# Patient Record
Sex: Female | Born: 1980 | Race: White | Hispanic: No | Marital: Married | Smoking: Former smoker
Health system: Southern US, Community
[De-identification: ages and names within clinical notes are randomized; demographics above are authoritative.]

## PROBLEM LIST (undated history)

## (undated) DIAGNOSIS — F410 Panic disorder [episodic paroxysmal anxiety] without agoraphobia: Secondary | ICD-10-CM

## (undated) DIAGNOSIS — E611 Iron deficiency: Secondary | ICD-10-CM

## (undated) DIAGNOSIS — Z8742 Personal history of other diseases of the female genital tract: Secondary | ICD-10-CM

## (undated) DIAGNOSIS — D649 Anemia, unspecified: Secondary | ICD-10-CM

## (undated) HISTORY — PX: TONSILLECTOMY: SUR1361

## (undated) HISTORY — PX: ABDOMINAL SURGERY: SHX537

---

## 2009-01-02 ENCOUNTER — Ambulatory Visit: Payer: Self-pay

## 2009-01-06 ENCOUNTER — Ambulatory Visit: Payer: Self-pay

## 2009-03-07 ENCOUNTER — Emergency Department: Payer: Self-pay | Admitting: Emergency Medicine

## 2009-09-20 ENCOUNTER — Emergency Department: Payer: Self-pay | Admitting: Emergency Medicine

## 2013-12-22 ENCOUNTER — Emergency Department: Payer: Self-pay | Admitting: Emergency Medicine

## 2013-12-22 LAB — BASIC METABOLIC PANEL
Anion Gap: 7 (ref 7–16)
BUN: 9 mg/dL (ref 7–18)
CO2: 29 mmol/L (ref 21–32)
CREATININE: 0.71 mg/dL (ref 0.60–1.30)
Calcium, Total: 8.6 mg/dL (ref 8.5–10.1)
Chloride: 105 mmol/L (ref 98–107)
EGFR (Non-African Amer.): >60
GLUCOSE: 125 mg/dL — AB (ref 65–99)
Osmolality: 281 (ref 275–301)
POTASSIUM: 3.6 mmol/L (ref 3.5–5.1)
SODIUM: 141 mmol/L (ref 136–145)

## 2013-12-22 LAB — CBC
HCT: 40 % (ref 35.0–47.0)
HGB: 13.3 g/dL (ref 12.0–16.0)
MCH: 27.1 pg (ref 26.0–34.0)
MCHC: 33.3 g/dL (ref 32.0–36.0)
MCV: 82 fL (ref 80–100)
PLATELETS: 282 10*3/uL (ref 150–440)
RBC: 4.9 10*6/uL (ref 3.80–5.20)
RDW: 13.9 % (ref 11.5–14.5)
WBC: 10.3 10*3/uL (ref 3.6–11.0)

## 2013-12-22 LAB — HCG, QUANTITATIVE, PREGNANCY

## 2014-01-20 ENCOUNTER — Emergency Department: Payer: Self-pay | Admitting: Emergency Medicine

## 2014-07-21 ENCOUNTER — Emergency Department
Admission: EM | Admit: 2014-07-21 | Discharge: 2014-07-21 | Disposition: A | Discharge status: Home / Self Care | Payer: BLUE CROSS/BLUE SHIELD | Attending: Emergency Medicine | Admitting: Emergency Medicine

## 2014-07-21 ENCOUNTER — Emergency Department: Payer: BLUE CROSS/BLUE SHIELD

## 2014-07-21 ENCOUNTER — Encounter: Payer: Self-pay | Admitting: Emergency Medicine

## 2014-07-21 DIAGNOSIS — Z87891 Personal history of nicotine dependence: Secondary | ICD-10-CM | POA: Insufficient documentation

## 2014-07-21 DIAGNOSIS — M62838 Other muscle spasm: Secondary | ICD-10-CM | POA: Diagnosis not present

## 2014-07-21 DIAGNOSIS — M62830 Muscle spasm of back: Secondary | ICD-10-CM | POA: Diagnosis not present

## 2014-07-21 DIAGNOSIS — M549 Dorsalgia, unspecified: Secondary | ICD-10-CM | POA: Diagnosis present

## 2014-07-21 MED ORDER — NAPROXEN 500 MG PO TABS: 500.0000 mg | ORAL_TABLET | Freq: Two times a day (BID) | ORAL | Status: AC

## 2014-07-21 MED ORDER — ORPHENADRINE CITRATE 30 MG/ML IJ SOLN
60.0000 mg | Freq: Once | INTRAMUSCULAR | Status: AC
  Administered 2014-07-21: 60 mg via INTRAMUSCULAR

## 2014-07-21 MED ORDER — ORPHENADRINE CITRATE 30 MG/ML IJ SOLN
INTRAMUSCULAR | Status: AC
  Filled 2014-07-21: qty 2

## 2014-07-21 MED ORDER — CYCLOBENZAPRINE HCL 10 MG PO TABS: 10.0000 mg | ORAL_TABLET | Freq: Three times a day (TID) | ORAL | Status: AC | PRN

## 2014-07-21 NOTE — Discharge Instructions (Signed)

## 2014-07-21 NOTE — ED Provider Notes (Signed)
CSN: [id number]     Arrival date & time 07/21/14  1024 History   First MD Initiated Contact with Patient 07/21/14 1058     Chief Complaint  Patient presents with  . Back Pain    HPI Comments: 34 year old morbidly obese female presents today complaining of lower neck and upper back pain that began yesterday morning. She reports that she has some mild soreness every morning when she wakes up in her lower neck. She has attributed it to her bed in the past. She has not had a known injury to her neck or back. She does not do any heavy lifting. A lot of times her pain will subside as the day goes on. This time her pain is unrelenting. Has taken some tramadol she had without relief of pain. No tingling or numbness in her legs or arms.  Patient is a 34 y.o. female presenting with back pain. The history is provided by the patient.  Back Pain Location:  Thoracic spine Quality:  Aching Radiates to:  Does not radiate Pain severity:  Moderate Pain is:  Same all the time Onset quality:  Gradual Timing:  Constant Progression:  Worsening Chronicity:  Recurrent Context: not emotional stress, not falling, not jumping from heights, not lifting heavy objects, not occupational injury, not physical stress and not recent injury   Relieved by:  None tried Worsened by:  Ambulation, bending, standing and twisting Ineffective treatments:  NSAIDs Associated symptoms: no bladder incontinence, no bowel incontinence, no dysuria, no fever, no leg pain, no numbness, no paresthesias, no tingling and no weakness   Risk factors: obesity     History reviewed. No pertinent past medical history. History reviewed. No pertinent past surgical history. No family history on file. History  Substance Use Topics  . Smoking status: Former Games developer  . Smokeless tobacco: Not on file  . Alcohol Use: Yes     Comment: occas   OB History    No data available     Review of Systems  Constitutional: Negative for fever.    Gastrointestinal: Negative for bowel incontinence.  Endocrine: Negative for polyuria.  Genitourinary: Negative for bladder incontinence and dysuria.  Musculoskeletal: Positive for myalgias, back pain, arthralgias and neck pain.  Neurological: Negative for tingling, weakness, numbness and paresthesias.  All other systems reviewed and are negative.     Allergies  Review of patient's allergies indicates no known allergies.  Home Medications   Prior to Admission medications   Medication Sig Start Date End Date Taking? Authorizing Provider  cyclobenzaprine (FLEXERIL) 10 MG tablet Take 1 tablet (10 mg total) by mouth every 8 (eight) hours as needed for muscle spasms. 07/21/14 07/21/15  Luvenia Redden, PA-C  naproxen (NAPROSYN) 500 MG tablet Take 1 tablet (500 mg total) by mouth 2 (two) times daily with a meal. 07/21/14 07/21/15  Wilber Oliphant V, PA-C   BP 119/80 mmHg  Pulse 69  Temp(Src) 97.5 F (36.4 C) (Oral)  Resp 18  Ht 5\' 6"  (1.676 m)  Wt 365 lb (165.563 kg)  BMI 58.94 kg/m2  SpO2 96%  LMP 07/14/2014 Physical Exam  Constitutional: She is oriented to person, place, and time. Vital signs are normal. She appears well-developed and well-nourished.  Non-toxic appearance. She does not have a sickly appearance.  Morbidly obese  HENT:  Head: Normocephalic and atraumatic.  Neck: Neck supple. Spinous process tenderness present.    Patient experiences pain with right lateral rotation. No pain with left lateral rotation. Has pain with flexion,  no pain with extension  Musculoskeletal: Normal range of motion.       Thoracic back: She exhibits tenderness, bony tenderness and spasm.       Lumbar back: Normal. She exhibits no tenderness, no bony tenderness and no spasm.       Back:  Upper thoracic spine tender to palpation. Bilateral thoracic paraspinal muscles tender to palpation with marked spasm.  Neurological: She is alert and oriented to person, place, and time. She has normal strength. No  sensory deficit. She exhibits normal muscle tone.  Equal grip strength bilateral upper extremities 5/5  Skin: Skin is warm and dry.  Psychiatric: She has a normal mood and affect. Her behavior is normal. Judgment and thought content normal.  Nursing note and vitals reviewed.   ED Course  Procedures (including critical care time) Labs Review Labs Reviewed - No data to display  Imaging Review Dg Cervical Spine Complete  07/21/2014   CLINICAL DATA:  34 year old female with posterior neck pain and upper back pain for the past 24 hours.  EXAM: CERVICAL SPINE  4+ VIEWS  COMPARISON:  Neck CT 12/22/2013.  FINDINGS: There is no evidence of cervical spine fracture or prevertebral soft tissue swelling. Alignment is normal. No other significant bone abnormalities are identified.  IMPRESSION: Negative cervical spine radiographs.   Electronically Signed   By: Trudie Reed M.D.   On: 07/21/2014 11:51   Dg Thoracic Spine 2 View  07/21/2014   CLINICAL DATA:  34 year old female with upper back pain for the past 24 hours.  EXAM: THORACIC SPINE - 2 VIEW  COMPARISON:  No priors.  FINDINGS: There is no evidence of thoracic spine fracture. Alignment is normal. No other significant bone abnormalities are identified.  IMPRESSION: Negative.   Electronically Signed   By: Trudie Reed M.D.   On: 07/21/2014 11:52     EKG Interpretation None      MDM  I have reviewed her cervical and thoracic spine x-rays. Patient has no evidence of early arthritis or compression fractures. I believe her pain is a result of muscle spasms which are palpable on exam. This could be because of her old mattress or from the persistent cough she has had for the past couple weeks. Advised her to use moist heat, stretching. Naproxen twice a day with food, Flexeril as needed for muscle spasms. Drowsiness warning given regarding Flexeril. Return for new or worsening symptoms. For persistent symptoms she should follow-up with Dr. Judi Saa,  orthopedist. Final diagnoses:  Neck muscle spasm  Back muscle spasm       Luvenia Redden, PA-C 07/21/14 1245  Jene Every, MD 07/21/14 1440

## 2014-07-21 NOTE — ED Notes (Signed)
Pt c/o neck/upper back pain, denies injury to area.

## 2014-07-21 NOTE — ED Notes (Signed)
Pt verbalized understanding of discharge instructions and prescriptions 

## 2015-04-25 ENCOUNTER — Emergency Department
Admission: EM | Admit: 2015-04-25 | Discharge: 2015-04-25 | Disposition: A | Discharge status: Home / Self Care | Payer: BLUE CROSS/BLUE SHIELD | Attending: Emergency Medicine | Admitting: Emergency Medicine

## 2015-04-25 ENCOUNTER — Emergency Department: Payer: BLUE CROSS/BLUE SHIELD

## 2015-04-25 DIAGNOSIS — J069 Acute upper respiratory infection, unspecified: Secondary | ICD-10-CM | POA: Diagnosis present

## 2015-04-25 DIAGNOSIS — J209 Acute bronchitis, unspecified: Secondary | ICD-10-CM | POA: Insufficient documentation

## 2015-04-25 DIAGNOSIS — Z87891 Personal history of nicotine dependence: Secondary | ICD-10-CM | POA: Diagnosis not present

## 2015-04-25 HISTORY — DX: Iron deficiency: E61.1

## 2015-04-25 LAB — BASIC METABOLIC PANEL
ANION GAP: 7 (ref 5–15)
BUN: 15 mg/dL (ref 6–20)
CHLORIDE: 104 mmol/L (ref 101–111)
CO2: 24 mmol/L (ref 22–32)
Calcium: 8.8 mg/dL — ABNORMAL LOW (ref 8.9–10.3)
Creatinine, Ser: 0.77 mg/dL (ref 0.44–1.00)
Glucose, Bld: 188 mg/dL — ABNORMAL HIGH (ref 65–99)
POTASSIUM: 3.4 mmol/L — AB (ref 3.5–5.1)
SODIUM: 135 mmol/L (ref 135–145)

## 2015-04-25 LAB — CBC WITH DIFFERENTIAL/PLATELET
BASOS ABS: 0 10*3/uL (ref 0–0.1)
BASOS PCT: 0 %
EOS ABS: 0 10*3/uL (ref 0–0.7)
Eosinophils Relative: 1 %
HCT: 38.3 % (ref 35.0–47.0)
HEMOGLOBIN: 12.9 g/dL (ref 12.0–16.0)
LYMPHS ABS: 1.1 10*3/uL (ref 1.0–3.6)
Lymphocytes Relative: 24 %
MCH: 25.5 pg — ABNORMAL LOW (ref 26.0–34.0)
MCHC: 33.8 g/dL (ref 32.0–36.0)
MCV: 75.6 fL — ABNORMAL LOW (ref 80.0–100.0)
Monocytes Absolute: 0.6 10*3/uL (ref 0.2–0.9)
Monocytes Relative: 13 %
NEUTROS PCT: 62 %
Neutro Abs: 2.9 10*3/uL (ref 1.4–6.5)
PLATELETS: 245 10*3/uL (ref 150–440)
RBC: 5.07 MIL/uL (ref 3.80–5.20)
RDW: 15.4 % — ABNORMAL HIGH (ref 11.5–14.5)
WBC: 4.7 10*3/uL (ref 3.6–11.0)

## 2015-04-25 MED ORDER — PSEUDOEPHEDRINE HCL 60 MG PO TABS: 60.0000 mg | ORAL_TABLET | ORAL | Status: DC | PRN

## 2015-04-25 MED ORDER — GUAIFENESIN-CODEINE 100-10 MG/5ML PO SOLN: 10.0000 mL | ORAL | Status: DC | PRN

## 2015-04-25 MED ORDER — AZITHROMYCIN 250 MG PO TABS: ORAL_TABLET | ORAL | Status: DC

## 2015-04-25 NOTE — ED Notes (Signed)
Pt c/o congestion/cough fever for the past couple of days, states today she is having dizziness.. Pt is in NAD.

## 2015-04-25 NOTE — ED Notes (Signed)
States she developed fever couple of days ago    Afebrile this am  But while at work she became dizzy.Marland Kitchen. Also having cough  congestion

## 2015-04-25 NOTE — Discharge Instructions (Signed)
Acute Bronchitis °Bronchitis is inflammation of the airways that extend from the windpipe into the lungs (bronchi). The inflammation often causes mucus to develop. This leads to a cough, which is the most common symptom of bronchitis.  °In acute bronchitis, the condition usually develops suddenly and goes away over time, usually in a couple weeks. Smoking, allergies, and asthma can make bronchitis worse. Repeated episodes of bronchitis may cause further lung problems.  °CAUSES °Acute bronchitis is most often caused by the same virus that causes a cold. The virus can spread from person to person (contagious) through coughing, sneezing, and touching contaminated objects. °SIGNS AND SYMPTOMS  °· Cough.   °· Fever.   °· Coughing up mucus.   °· Body aches.   °· Chest congestion.   °· Chills.   °· Shortness of breath.   °· Sore throat.   °DIAGNOSIS  °Acute bronchitis is usually diagnosed through a physical exam. Your health care provider will also ask you questions about your medical history. Tests, such as chest X-rays, are sometimes done to rule out other conditions.  °TREATMENT  °Acute bronchitis usually goes away in a couple weeks. Oftentimes, no medical treatment is necessary. Medicines are sometimes given for relief of fever or cough. Antibiotic medicines are usually not needed but may be prescribed in certain situations. In some cases, an inhaler may be recommended to help reduce shortness of breath and control the cough. A cool mist vaporizer may also be used to help thin bronchial secretions and make it easier to clear the chest.  °HOME CARE INSTRUCTIONS °· Get plenty of rest.   °· Drink enough fluids to keep your urine clear or pale yellow (unless you have a medical condition that requires fluid restriction). Increasing fluids may help thin your respiratory secretions (sputum) and reduce chest congestion, and it will prevent dehydration.   °· Take medicines only as directed by your health care provider. °· If  you were prescribed an antibiotic medicine, finish it all even if you start to feel better. °· Avoid smoking and secondhand smoke. Exposure to cigarette smoke or irritating chemicals will make bronchitis worse. If you are a smoker, consider using nicotine gum or skin patches to help control withdrawal symptoms. Quitting smoking will help your lungs heal faster.   °· Reduce the chances of another bout of acute bronchitis by washing your hands frequently, avoiding people with cold symptoms, and trying not to touch your hands to your mouth, nose, or eyes.   °· Keep all follow-up visits as directed by your health care provider.   °SEEK MEDICAL CARE IF: °Your symptoms do not improve after 1 week of treatment.  °SEEK IMMEDIATE MEDICAL CARE IF: °· You develop an increased fever or chills.   °· You have chest pain.   °· You have severe shortness of breath. °· You have bloody sputum.   °· You develop dehydration. °· You faint or repeatedly feel like you are going to pass out. °· You develop repeated vomiting. °· You develop a severe headache. °MAKE SURE YOU:  °· Understand these instructions. °· Will watch your condition. °· Will get help right away if you are not doing well or get worse. °  °This information is not intended to replace advice given to you by your health care provider. Make sure you discuss any questions you have with your health care provider. °  °Document Released: 02/29/2004 Document Revised: 02/11/2014 Document Reviewed: 07/14/2012 °Elsevier Interactive Patient Education ©2016 [employer] ° °Upper Respiratory Infection, Adult °Most upper respiratory infections (URIs) are a viral infection of the air passages leading   to the lungs. A URI affects the nose, throat, and upper air passages. The most common type of URI is nasopharyngitis and is typically referred to as "the common cold." °URIs run their course and usually go away on their own. Most of the time, a URI does not require medical attention, but  sometimes a bacterial infection in the upper airways can follow a viral infection. This is called a secondary infection. Sinus and middle ear infections are common types of secondary upper respiratory infections. °Bacterial pneumonia can also complicate a URI. A URI can worsen asthma and chronic obstructive pulmonary disease (COPD). Sometimes, these complications can require emergency medical care and may be life threatening.  °CAUSES °Almost all URIs are caused by viruses. A virus is a type of germ and can spread from one person to another.  °RISKS FACTORS °You may be at risk for a URI if:  °· You smoke.   °· You have chronic heart or lung disease. °· You have a weakened defense (immune) system.   °· You are very young or very old.   °· You have nasal allergies or asthma. °· You work in crowded or poorly ventilated areas. °· You work in health care facilities or schools. °SIGNS AND SYMPTOMS  °Symptoms typically develop 2-3 days after you come in contact with a cold virus. Most viral URIs last 7-10 days. However, viral URIs from the influenza virus (flu virus) can last 14-18 days and are typically more severe. Symptoms may include:  °· Runny or stuffy (congested) nose.   °· Sneezing.   °· Cough.   °· Sore throat.   °· Headache.   °· Fatigue.   °· Fever.   °· Loss of appetite.   °· Pain in your forehead, behind your eyes, and over your cheekbones (sinus pain). °· Muscle aches.   °DIAGNOSIS  °Your health care provider may diagnose a URI by: °· Physical exam. °· Tests to check that your symptoms are not due to another condition such as: °¨ Strep throat. °¨ Sinusitis. °¨ Pneumonia. °¨ Asthma. °TREATMENT  °A URI goes away on its own with time. It cannot be cured with medicines, but medicines may be prescribed or recommended to relieve symptoms. Medicines may help: °· Reduce your fever. °· Reduce your cough. °· Relieve nasal congestion. °HOME CARE INSTRUCTIONS  °· Take medicines only as directed by your health care  provider.   °· Gargle warm saltwater or take cough drops to comfort your throat as directed by your health care provider. °· Use a warm mist humidifier or inhale steam from a shower to increase air moisture. This may make it easier to breathe. °· Drink enough fluid to keep your urine clear or pale yellow.   °· Eat soups and other clear broths and maintain good nutrition.   °· Rest as needed.   °· Return to work when your temperature has returned to normal or as your health care provider advises. You may need to stay home longer to avoid infecting others. You can also use a face mask and careful hand washing to prevent spread of the virus. °· Increase the usage of your inhaler if you have asthma.   °· Do not use any tobacco products, including cigarettes, chewing tobacco, or electronic cigarettes. If you need help quitting, ask your health care provider. °PREVENTION  °The best way to protect yourself from getting a cold is to practice good hygiene.  °· Avoid oral or hand contact with people with cold symptoms.   °· Wash your hands often if contact occurs.   °There is no clear evidence that   vitamin C, vitamin E, echinacea, or exercise reduces the chance of developing a cold. However, it is always recommended to get plenty of rest, exercise, and practice good nutrition.  °SEEK MEDICAL CARE IF:  °· You are getting worse rather than better.   °· Your symptoms are not controlled by medicine.   °· You have chills. °· You have worsening shortness of breath. °· You have brown or red mucus. °· You have yellow or brown nasal discharge. °· You have pain in your face, especially when you bend forward. °· You have a fever. °· You have swollen neck glands. °· You have pain while swallowing. °· You have white areas in the back of your throat. °SEEK IMMEDIATE MEDICAL CARE IF:  °· You have severe or persistent: °¨ Headache. °¨ Ear pain. °¨ Sinus pain. °¨ Chest pain. °· You have chronic lung disease and any of the  following: °¨ Wheezing. °¨ Prolonged cough. °¨ Coughing up blood. °¨ A change in your usual mucus. °· You have a stiff neck. °· You have changes in your: °¨ Vision. °¨ Hearing. °¨ Thinking. °¨ Mood. °MAKE SURE YOU:  °· Understand these instructions. °· Will watch your condition. °· Will get help right away if you are not doing well or get worse. °  °This information is not intended to replace advice given to you by your health care provider. Make sure you discuss any questions you have with your health care provider. °  °Document Released: 07/17/2000 Document Revised: 06/07/2014 Document Reviewed: 04/28/2013 °Elsevier Interactive Patient Education ©2016 [employer]vier Inc. ° °

## 2015-04-25 NOTE — ED Provider Notes (Signed)
[facility] Emergency Department Provider Note  ____________________________________________  Time seen: Approximately 11:45 AM  I have reviewed the triage vital signs and the nursing notes.   HISTORY  Chief Complaint Dizziness and URI    HPI Carla Daniels is a 35 y.o. female presents for evaluation of fever chills body aches coughing and dizziness. Patient states that she does not have a local PCP and is concerned about possible pneumonia or bronchitis or the flu.   Past Medical History  Diagnosis Date  . Iron deficiency     There are no active problems to display for this patient.   Past Surgical History  Procedure Laterality Date  . Tonsillectomy      Current Outpatient Rx  Name  Route  Sig  Dispense  Refill  . azithromycin (ZITHROMAX Z-PAK) 250 MG tablet      Take 2 tablets (500 mg) on  Day 1,  followed by 1 tablet (250 mg) once daily on Days 2 through 5.   6 each   0   . cyclobenzaprine (FLEXERIL) 10 MG tablet   Oral   Take 1 tablet (10 mg total) by mouth every 8 (eight) hours as needed for muscle spasms.   30 tablet   0   . guaiFENesin-codeine 100-10 MG/5ML syrup   Oral   Take 10 mLs by mouth every 4 (four) hours as needed for cough.   180 mL   0   . naproxen (NAPROSYN) 500 MG tablet   Oral   Take 1 tablet (500 mg total) by mouth 2 (two) times daily with a meal.   30 tablet   0   . pseudoephedrine (SUDAFED) 60 MG tablet   Oral   Take 1 tablet (60 mg total) by mouth every 4 (four) hours as needed for congestion.   24 tablet   0     Allergies Review of patient's allergies indicates no known allergies.  No family history on file.  Social History Social History  Substance Use Topics  . Smoking status: Former Games developer  . Smokeless tobacco: None  . Alcohol Use: Yes     Comment: occas    Review of Systems Constitutional: Positive fever/chills Eyes: No visual changes. ENT: No sore throat. Cardiovascular:  Denies chest pain. Respiratory: Denies shortness of breath. Positive for coughing Gastrointestinal: No abdominal pain.  No nausea, no vomiting.  No diarrhea.  No constipation. Genitourinary: Negative for dysuria. Musculoskeletal: Negative for back pain. Positive morbid obesity Skin: Negative for rash. Neurological: Negative for headaches, focal weakness or numbness.  10-point ROS otherwise negative.  ____________________________________________   PHYSICAL EXAM:  VITAL SIGNS: ED Triage Vitals  Enc Vitals Group     BP 04/25/15 1054 123/61 mmHg     Pulse Rate 04/25/15 1054 93     Resp 04/25/15 1054 22     Temp 04/25/15 1054 98.7 F (37.1 C)     Temp Source 04/25/15 1054 Oral     SpO2 04/25/15 1054 96 %     Weight 04/25/15 1054 370 lb (167.831 kg)     Height 04/25/15 1054  (1.676 m)     Head Cir --      Peak Flow --      Pain Score 04/25/15 1113 5     Pain Loc --      Pain Edu? --      Excl. in GC? --     Constitutional: Alert and oriented. Well appearing and in no acute  distress. Eyes: Conjunctivae are normal. PERRL. EOMI. Head: Atraumatic. Nose: No congestion/rhinnorhea. Mouth/Throat: Mucous membranes are moist.  Oropharynx non-erythematous. Neck: No stridor.   Cardiovascular: Normal rate, regular rhythm. Grossly normal heart sounds.  Good peripheral circulation. Respiratory: Normal respiratory effort.  No retractions. Lungs CTAB. Gastrointestinal: Soft and nontender. No distention. No abdominal bruits. No CVA tenderness. Musculoskeletal: No lower extremity tenderness nor edema.  No joint effusions. Neurologic:  Normal speech and language. No gross focal neurologic deficits are appreciated. No gait instability. Skin:  Skin is warm, dry and intact. No rash noted. Psychiatric: Mood and affect are normal. Speech and behavior are normal.  ____________________________________________   LABS (all labs ordered are listed, but only abnormal results are  displayed)  Labs Reviewed  CBC WITH DIFFERENTIAL/PLATELET - Abnormal; Notable for the following:    MCV 75.6 (*)    MCH 25.5 (*)    RDW 15.4 (*)    All other components within normal limits  BASIC METABOLIC PANEL - Abnormal; Notable for the following:    Potassium 3.4 (*)    Glucose, Bld 188 (*)    Calcium 8.8 (*)    All other components within normal limits   ____________________________________________  EKG   ____________________________________________  RADIOLOGY  Negative for any acute cardiopulmonary findings. ____________________________________________   PROCEDURES  Procedure(s) performed: None  Critical Care performed: No  ____________________________________________   INITIAL IMPRESSION / ASSESSMENT AND PLAN / ED COURSE  Pertinent labs & imaging results that were available during my care of the patient were reviewed by me and considered in my medical decision making (see chart for details).  Acute upper respiratory/orchitis. Morbid obesity. Probable new onset diabetes. Referral given to several local clinics to establish care Rx Zithromax, Robitussin-AC and event. Patient to return to the ER with any worsening symptomology. Work excuse 24 hours given ____________________________________________   FINAL CLINICAL IMPRESSION(S) / ED DIAGNOSES  Final diagnoses:  URI, acute  Acute bronchitis, unspecified organism     This chart was dictated using voice recognition software/Dragon. Despite best efforts to proofread, errors can occur which can change the meaning. Any change was purely unintentional.   Evangeline Dakinharles M Beers, PA-C 04/25/15 1351  Myrna Blazeravid Matthew Schaevitz, MD 04/25/15 [phone]

## 2015-12-10 ENCOUNTER — Emergency Department
Admission: EM | Admit: 2015-12-10 | Discharge: 2015-12-10 | Disposition: A | Discharge status: Home / Self Care | Payer: BLUE CROSS/BLUE SHIELD | Attending: Emergency Medicine | Admitting: Emergency Medicine

## 2015-12-10 DIAGNOSIS — Z87891 Personal history of nicotine dependence: Secondary | ICD-10-CM | POA: Diagnosis not present

## 2015-12-10 DIAGNOSIS — H9203 Otalgia, bilateral: Secondary | ICD-10-CM | POA: Diagnosis present

## 2015-12-10 DIAGNOSIS — H6063 Unspecified chronic otitis externa, bilateral: Secondary | ICD-10-CM | POA: Insufficient documentation

## 2015-12-10 MED ORDER — CIPROFLOXACIN-DEXAMETHASONE 0.3-0.1 % OT SUSP: 4.0000 [drp] | Freq: Two times a day (BID) | OTIC | 0 refills | Status: AC

## 2015-12-10 MED ORDER — TRAMADOL HCL 50 MG PO TABS: 50.0000 mg | ORAL_TABLET | Freq: Four times a day (QID) | ORAL | 0 refills | Status: DC | PRN

## 2015-12-10 MED ORDER — LIDOCAINE HCL 2 % EX GEL
1.0000 "application " | Freq: Once | CUTANEOUS | Status: AC
  Administered 2015-12-10: 1 via TOPICAL
  Filled 2015-12-10: qty 5

## 2015-12-10 MED ORDER — TRAMADOL HCL 50 MG PO TABS
50.0000 mg | ORAL_TABLET | Freq: Once | ORAL | Status: AC
  Administered 2015-12-10: 50 mg via ORAL
  Filled 2015-12-10: qty 1

## 2015-12-10 NOTE — ED Notes (Signed)
Patient reports having bilateral ear ache off and on for the past 2 weeks.  Patient reports came to the ED tonight due to husband making her because she was crying out in her sleep.

## 2015-12-10 NOTE — ED Notes (Signed)
Pt triaged on paper due to computer down time 

## 2015-12-10 NOTE — ED Provider Notes (Signed)
[facility] Emergency Department Provider Note   ____________________________________________   First MD Initiated Contact with Patient 12/10/15 0408     (approximate)  I have reviewed the triage vital signs and the nursing notes.   HISTORY  Chief Complaint Ear Pain   HPI Carla Daniels is a 35 y.o. female who comes into the hospital today with earaches. The patient reports that both of her ears have been hurting on and off for the past 2 weeks. She reports that she woke up tonight with pain in both of her ears but currently at the left greater than the right. The patient reports that she has been seen at the walk-in clinic a month ago and treated for ear infections. The patient was given amoxicillin but would vomit after taking it so she did not complete the course. The patient will stop her significant other in her sleep because she was having some pain. She reports that her left ear still is a 10 out of 10 pain in her right ear as a 2 out of 10 pain. The patient has not had any drainage, fevers, cough, runny nose. She is here today for evaluation and treatment of this ear pain.   Past Medical History:  Diagnosis Date  . Iron deficiency     There are no active problems to display for this patient.   Past Surgical History:  Procedure Laterality Date  . TONSILLECTOMY      Prior to Admission medications   Medication Sig Start Date End Date Taking? Authorizing Provider  azithromycin (ZITHROMAX Z-PAK) 250 MG tablet Take 2 tablets (500 mg) on  Day 1,  followed by 1 tablet (250 mg) once daily on Days 2 through 5. 04/25/15   Evangeline Dakinharles M Beers, PA-C  ciprofloxacin-dexamethasone (CIPRODEX) otic suspension Place 4 drops into both ears 2 (two) times daily. 12/10/15 12/20/15  Rebecka ApleyAllison P Webster, MD  guaiFENesin-codeine 100-10 MG/5ML syrup Take 10 mLs by mouth every 4 (four) hours as needed for cough. 04/25/15   Charmayne Sheerharles M Beers, PA-C  pseudoephedrine (SUDAFED) 60 MG  tablet Take 1 tablet (60 mg total) by mouth every 4 (four) hours as needed for congestion. 04/25/15   Charmayne Sheerharles M Beers, PA-C  traMADol (ULTRAM) 50 MG tablet Take 1 tablet (50 mg total) by mouth every 6 (six) hours as needed. 12/10/15   Rebecka ApleyAllison P Webster, MD    Allergies Patient has no known allergies.  No family history on file.  Social History Social History  Substance Use Topics  . Smoking status: Former Games developermoker  . Smokeless tobacco: Not on file  . Alcohol use Yes     Comment: occas    Review of Systems Constitutional: No fever/chills Eyes: No visual changes. ENT: Bilateral ear pain Cardiovascular: Denies chest pain. Respiratory: Denies shortness of breath. Gastrointestinal: No abdominal pain.  No nausea, no vomiting.  No diarrhea.  No constipation. Genitourinary: Negative for dysuria. Musculoskeletal: Negative for back pain. Skin: Negative for rash. Neurological: Negative for headaches, focal weakness or numbness.  10-point ROS otherwise negative.  ____________________________________________   PHYSICAL EXAM:  VITAL SIGNS: ED Triage Vitals  Enc Vitals Group     BP                                              12/10/15  150 134/67     Pulse  12/10/15  150 88     Resp                                           12/10/15  150 24     Temp                                           12/10/15  150 98.3     Temp src      SpO2                                           12/10/15  150 93% on RA     Weight      Height      Head Circumference      Peak Flow      Pain Score                                    12/10/15  150 10     Pain Loc      Pain Edu?      Excl. in GC?     Constitutional: Alert and oriented. Well appearing and in Mild distress. Ears: TMs gray flattened dull with some erythema to the ear canals bilaterally left greater than right Eyes: Conjunctivae are normal. PERRL. EOMI. Head: Atraumatic. Nose: No  congestion/rhinnorhea. Mouth/Throat: Mucous membranes are moist.  Oropharynx non-erythematous. Cardiovascular: Normal rate, regular rhythm. Grossly normal heart sounds.   Respiratory: Normal respiratory effort.  No retractions. Lungs CTAB. Gastrointestinal: Soft and nontender. No distention. Positive bowel sounds Musculoskeletal: No lower extremity tenderness nor edema.   Neurologic:  Normal speech and language.  Skin:  Skin is warm, dry and intact.  Psychiatric: Mood and affect are normal.   ____________________________________________   LABS (all labs ordered are listed, but only abnormal results are displayed)  Labs Reviewed - No data to display ____________________________________________  EKG  none ____________________________________________  RADIOLOGY  none ____________________________________________   PROCEDURES  Procedure(s) performed: None  Procedures  Critical Care performed: No  ____________________________________________   INITIAL IMPRESSION / ASSESSMENT AND PLAN / ED COURSE  Pertinent labs & imaging results that were available during my care of the patient were reviewed by me and considered in my medical decision making (see chart for details).  This is a 35 year old female who comes into the hospital today with some bilateral ear pain. Looking at the patient ears and examining them I'm concerned the patient may have some otitis externa. I will place some lidocaine in the patient's ears for control of her pain as well as give the patient a dose of tramadol. The patient will be given a prescription for Ciprodex which will help with the infection and she should follow back up at the walk-in clinic. The patient has no further complaints or concerns.  Clinical Course      ____________________________________________   FINAL CLINICAL IMPRESSION(S) / ED DIAGNOSES  Final diagnoses:  Chronic otitis externa of both ears, unspecified type      NEW  MEDICATIONS STARTED DURING THIS VISIT:  New Prescriptions   CIPROFLOXACIN-DEXAMETHASONE (  CIPRODEX) OTIC SUSPENSION    Place 4 drops into both ears 2 (two) times daily.   TRAMADOL (ULTRAM) 50 MG TABLET    Take 1 tablet (50 mg total) by mouth every 6 (six) hours as needed.     Note:  This document was prepared using Dragon voice recognition software and may include unintentional dictation errors.    Rebecka Apley, MD 12/10/15 [phone]

## 2017-04-15 ENCOUNTER — Emergency Department
Admission: EM | Admit: 2017-04-15 | Discharge: 2017-04-15 | Disposition: A | Discharge status: Home / Self Care | Payer: BLUE CROSS/BLUE SHIELD | Attending: Student in an Organized Health Care Education/Training Program | Admitting: Student in an Organized Health Care Education/Training Program

## 2017-04-15 ENCOUNTER — Other Ambulatory Visit: Payer: Self-pay

## 2017-04-15 ENCOUNTER — Encounter: Payer: Self-pay | Admitting: *Deleted

## 2017-04-15 DIAGNOSIS — Z79899 Other long term (current) drug therapy: Secondary | ICD-10-CM | POA: Diagnosis not present

## 2017-04-15 DIAGNOSIS — Z87891 Personal history of nicotine dependence: Secondary | ICD-10-CM | POA: Diagnosis not present

## 2017-04-15 DIAGNOSIS — R600 Localized edema: Secondary | ICD-10-CM | POA: Diagnosis present

## 2017-04-15 DIAGNOSIS — T7840XA Allergy, unspecified, initial encounter: Secondary | ICD-10-CM

## 2017-04-15 DIAGNOSIS — T781XXA Other adverse food reactions, not elsewhere classified, initial encounter: Secondary | ICD-10-CM | POA: Insufficient documentation

## 2017-04-15 MED ORDER — PREDNISONE 20 MG PO TABS: 40.0000 mg | ORAL_TABLET | Freq: Every day | ORAL | 0 refills | Status: AC

## 2017-04-15 MED ORDER — EPINEPHRINE 0.3 MG/0.3ML IJ SOAJ: 0.3000 mg | Freq: Once | INTRAMUSCULAR | 0 refills | Status: AC

## 2017-04-15 MED ORDER — PREDNISONE 20 MG PO TABS
60.0000 mg | ORAL_TABLET | Freq: Once | ORAL | Status: AC
  Administered 2017-04-15: 60 mg via ORAL
  Filled 2017-04-15: qty 3

## 2017-04-15 MED ORDER — DIPHENHYDRAMINE HCL 25 MG PO CAPS
50.0000 mg | ORAL_CAPSULE | Freq: Once | ORAL | Status: AC
  Administered 2017-04-15: 50 mg via ORAL
  Filled 2017-04-15: qty 2

## 2017-04-15 NOTE — ED Notes (Signed)
Family at bedside. Pt on monitor and in NAD at this time.

## 2017-04-15 NOTE — ED Provider Notes (Signed)
[facility] Emergency Department Provider Note    First MD Initiated Contact with Patient 04/15/17 1326     (approximate)  I have reviewed the triage vital signs and the nursing notes.   HISTORY  Chief Complaint Allergic Reaction    HPI Carla Daniels is a 37 y.o. 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  Previous episodes had quickly resolved so the patient ignored this and went back to work but started developing shortness of breath.  She caught her coworker who called EMS.  EpiPen was not given is that her symptoms were improving.  She presents to the ER today that she still feels some swelling in her upper lip but otherwise is improving.  No shortness of breath.  No other previous history of allergies.  Past Medical History:  Diagnosis Date  . Iron deficiency    History reviewed. No pertinent family history. Past Surgical History:  Procedure Laterality Date  . TONSILLECTOMY     There are no active problems to display for this patient.     Prior to Admission medications   Medication Sig Start Date End Date Taking? Authorizing Provider  azithromycin (ZITHROMAX Z-PAK) 250 MG tablet Take 2 tablets (500 mg) on  Day 1,  followed by 1 tablet (250 mg) once daily on Days 2 through 5. 04/25/15   Beers, Charmayne Sheerharles M, PA-C  guaiFENesin-codeine 100-10 MG/5ML syrup Take 10 mLs by mouth every 4 (four) hours as needed for cough. 04/25/15   Beers, Charmayne Sheerharles M, PA-C  pseudoephedrine (SUDAFED) 60 MG tablet Take 1 tablet (60 mg total) by mouth every 4 (four) hours as needed for congestion. 04/25/15   Beers, Charmayne Sheerharles M, PA-C  traMADol (ULTRAM) 50 MG tablet Take 1 tablet (50 mg total) by mouth every 6 (six) hours as needed. 12/10/15   Rebecka ApleyWebster, Allison P, MD    Allergies Patient has no known  allergies.    Social History Social History   Tobacco Use  . Smoking status: Former Games developermoker  . Smokeless tobacco: Never Used  Substance Use Topics  . Alcohol use: Yes    Comment: occas  . Drug use: No    Review of Systems Patient denies headaches, rhinorrhea, blurry vision, numbness, shortness of breath, chest pain, edema, cough, abdominal pain, nausea, vomiting, diarrhea, dysuria, fevers, rashes or hallucinations unless otherwise stated above in HPI. ____________________________________________   PHYSICAL EXAM:  VITAL SIGNS: Vitals:   04/15/17 1326 04/15/17 1329  BP:  (!) 146/104  Pulse:  89  Resp:  17  Temp:  98.8 F (37.1 C)  SpO2: 95% 99%    Constitutional: Alert and oriented. Well appearing and in no acute distress. Eyes: Conjunctivae are normal.  Head: Atraumatic. Nose: No congestion/rhinnorhea. Mouth/Throat: Mucous membranes are moist.   Neck: No stridor. Painless ROM.  Cardiovascular: Normal rate, regular rhythm. Grossly normal heart sounds.  Good peripheral circulation. Respiratory: Normal respiratory effort.  No retractions. Lungs CTAB. Gastrointestinal: Soft and nontender. No distention. No abdominal bruits. No CVA tenderness. Genitourinary:  Musculoskeletal: No lower extremity tenderness nor edema.  No joint effusions. Neurologic:  Normal speech and language. No gross focal neurologic deficits are appreciated. No facial droop Skin:  Skin is warm, dry and intact. No rash noted. Psychiatric: Mood and affect are normal. Speech and behavior are normal.  ____________________________________________   LABS (  all labs ordered are listed, but only abnormal results are displayed)  No results found for this or any previous visit (from the past 24 hour(s)). ____________________________________________ ____________________________________________   PROCEDURES  Procedure(s) performed:  Procedures    Critical Care performed:  no ____________________________________________   INITIAL IMPRESSION / ASSESSMENT AND PLAN / ED COURSE  Pertinent labs & imaging results that were available during my care of the patient were reviewed by me and considered in my medical decision making (see chart for details).  DDX: anaphylaxis, anaphylactoid, urticaria  Carla Daniels is a 37 y.o. who presents to the ED with symptoms as described above.  Patient afebrile Heema dynamically stable.  Well-appearing.  Symptoms seem to be improving.  Patient was given steroids as well as Benadryl.  Will be given prescription for EpiPen.  Do not feel further observation indicated at this time as symptoms have nearly resolved.  Have discussed with the patient and available family all diagnostics and treatments performed thus far and all questions were answered to the best of my ability. The patient demonstrates understanding and agreement with plan.       ____________________________________________   FINAL CLINICAL IMPRESSION(S) / ED DIAGNOSES  Final diagnoses:  Allergic reaction, initial encounter      NEW MEDICATIONS STARTED DURING THIS VISIT:  New Prescriptions   No medications on file     Note:  This document was prepared using Dragon voice recognition software and may include unintentional dictation errors.    Willy Eddy, MD 04/15/17 [phone]

## 2017-04-15 NOTE — ED Notes (Signed)
MD at bedside to assess and update pt on treatment plan. Pt reports understanding of medications and interventions as well as observation period.

## 2017-04-15 NOTE — ED Triage Notes (Signed)
PT to ED from work reporting that she ate pepperoni 1 hour ago and has since noted "tingling" in her face and mouth. PT reports feeling as though her mouth is swollen but denies feeling as though her tongue or airway have swollen. Sudden onset of SOB after onset of reaction. Pt also reporting dry mouth. Pt reported to EMS that symptoms have improved since calling EMS.   Similar reaction to pepperoni in the past. No need for Epipen reported at that event.

## 2017-04-15 NOTE — ED Notes (Signed)
Pt remains stable and reports feeling able to ambulate independently. Pt and family have been educated on follow up care. No further questions at this time.

## 2017-04-22 ENCOUNTER — Emergency Department
Admission: EM | Admit: 2017-04-22 | Discharge: 2017-04-22 | Disposition: A | Discharge status: Home / Self Care | Payer: BLUE CROSS/BLUE SHIELD | Attending: Emergency Medicine | Admitting: Emergency Medicine

## 2017-04-22 ENCOUNTER — Encounter: Payer: Self-pay | Admitting: Emergency Medicine

## 2017-04-22 ENCOUNTER — Other Ambulatory Visit: Payer: Self-pay

## 2017-04-22 DIAGNOSIS — T7840XA Allergy, unspecified, initial encounter: Secondary | ICD-10-CM | POA: Insufficient documentation

## 2017-04-22 DIAGNOSIS — Z79899 Other long term (current) drug therapy: Secondary | ICD-10-CM | POA: Insufficient documentation

## 2017-04-22 DIAGNOSIS — Z87891 Personal history of nicotine dependence: Secondary | ICD-10-CM | POA: Insufficient documentation

## 2017-04-22 MED ORDER — FAMOTIDINE IN NACL 20-0.9 MG/50ML-% IV SOLN
20.0000 mg | Freq: Once | INTRAVENOUS | Status: AC
  Administered 2017-04-22: 20 mg via INTRAVENOUS
  Filled 2017-04-22: qty 50

## 2017-04-22 MED ORDER — DIPHENHYDRAMINE HCL 50 MG/ML IJ SOLN
50.0000 mg | Freq: Once | INTRAMUSCULAR | Status: AC
  Administered 2017-04-22: 50 mg via INTRAVENOUS
  Filled 2017-04-22: qty 1

## 2017-04-22 MED ORDER — ONDANSETRON HCL 4 MG/2ML IJ SOLN
4.0000 mg | Freq: Once | INTRAMUSCULAR | Status: AC
  Administered 2017-04-22: 4 mg via INTRAVENOUS
  Filled 2017-04-22: qty 2

## 2017-04-22 MED ORDER — METHYLPREDNISOLONE SODIUM SUCC 125 MG IJ SOLR
125.0000 mg | Freq: Once | INTRAMUSCULAR | Status: AC
  Administered 2017-04-22: 125 mg via INTRAVENOUS
  Filled 2017-04-22: qty 2

## 2017-04-22 MED ORDER — PREDNISONE 10 MG (21) PO TBPK: ORAL_TABLET | ORAL | 0 refills | Status: DC

## 2017-04-22 MED ORDER — SODIUM CHLORIDE 0.9 % IV BOLUS (SEPSIS)
1000.0000 mL | Freq: Once | INTRAVENOUS | Status: AC
  Administered 2017-04-22: 1000 mL via INTRAVENOUS

## 2017-04-22 NOTE — ED Triage Notes (Addendum)
Pt to triage via w/c; pt reports ?allergic rx PTA [facility]; st feeling swelling and pain to throat; seen recently for same; denies itching or rash, denies swelling to tongue; no swelling noted upon visual inspection of mouth and back of throat; pt noted in triage to continuously open mouth widely; when asked why, st she feels as if her cheeks are swollen--none noted; st received benadryl and steroids with previous visit with relief

## 2017-04-22 NOTE — Discharge Instructions (Signed)
Follow-up with your regular doctor for recheck in 3 days.  Call the allergist as you were previously instructed.  Take the medication as prescribed.  Be sure to keep your EpiPen with you at all times.  Return to the emergency department if you are worsening

## 2017-04-22 NOTE — ED Notes (Signed)
Pt ambulatory to wheel chair upon discharge. Verbalized understanding of discharge instructions, follow-up care and prescription. VSS. Skin warm and dry. A&O x4.  

## 2017-04-22 NOTE — ED Notes (Signed)
ED Provider at bedside. 

## 2017-04-22 NOTE — ED Notes (Signed)
IV placed.

## 2017-04-22 NOTE — ED Provider Notes (Signed)
[facility] Emergency Department Provider Note  ____________________________________________   First MD Initiated Contact with Patient 04/22/17 2119     (approximate)  I have reviewed the triage vital signs and the nursing notes.   HISTORY  Chief Complaint Allergic Reaction    HPI Carla Daniels is a 37 y.o. female presents emergency department after eating at [employer].  She states she feels like her face is swelling in her throat is starting to swell.  She states that the swelling is all in her cheeks.  She denies any swelling to the tongue.  She denies chest pain or shortness of breath.  She had an allergic reaction like this to pepperoni.  She was given an EpiPen.  However her EpiPen is at home in her purse.  She ate shrimp and a baked potato tonight  Past Medical History:  Diagnosis Date  . Iron deficiency     There are no active problems to display for this patient.   Past Surgical History:  Procedure Laterality Date  . TONSILLECTOMY      Prior to Admission medications   Medication Sig Start Date End Date Taking? Authorizing Provider  Ascorbic Acid (VITAMIN C ADULT GUMMIES PO) Take 1 Piece by mouth daily.    [provider]  azithromycin (ZITHROMAX Z-PAK) 250 MG tablet Take 2 tablets (500 mg) on  Day 1,  followed by 1 tablet (250 mg) once daily on Days 2 through 5. Patient not taking: Reported on 04/15/2017 04/25/15   Evangeline Dakin, PA-C  guaiFENesin-codeine 100-10 MG/5ML syrup Take 10 mLs by mouth every 4 (four) hours as needed for cough. Patient not taking: Reported on 04/15/2017 04/25/15   Evangeline Dakin, PA-C  ibuprofen (ADVIL,MOTRIN) 200 MG tablet Take 200-400 mg by mouth every 6 (six) hours as needed.    [provider]  Melatonin 5 MG TABS Take 5 mg by mouth at bedtime as needed.    [provider]  predniSONE (STERAPRED UNI-PAK 21 TAB) 10 MG (21) TBPK tablet Take 6 pills on day one then decrease by 1 pill  each day 04/22/17   Faythe Ghee, PA-C  pseudoephedrine (SUDAFED) 60 MG tablet Take 1 tablet (60 mg total) by mouth every 4 (four) hours as needed for congestion. Patient not taking: Reported on 04/15/2017 04/25/15   Evangeline Dakin, PA-C  traMADol (ULTRAM) 50 MG tablet Take 1 tablet (50 mg total) by mouth every 6 (six) hours as needed. Patient not taking: Reported on 04/15/2017 12/10/15   Rebecka Apley, MD    Allergies Patient has no known allergies.  No family history on file.  Social History Social History   Tobacco Use  . Smoking status: Former Games developer  . Smokeless tobacco: Never Used  Substance Use Topics  . Alcohol use: Yes    Comment: occas  . Drug use: No    Review of Systems  Constitutional: No fever/chills Eyes: No visual changes. ENT: No sore throat.  Positive for swelling in the cheeks and lips Respiratory: Denies cough Genitourinary: Negative for dysuria. Musculoskeletal: Negative for back pain. Skin: Negative for rash.    ____________________________________________   PHYSICAL EXAM:  VITAL SIGNS: ED Triage Vitals  Enc Vitals Group     BP 04/22/17 2054 (!) 142/74     Pulse Rate 04/22/17 2054 (!) 105     Resp 04/22/17 2054 20     Temp 04/22/17 2054 97.7 F (36.5 C)     Temp Source 04/22/17  2054 Oral     SpO2 04/22/17 2054 97 %     Weight 04/22/17 2055 (!) 380 lb (172.4 kg)     Height 04/22/17 2055 5\' 6"  (1.676 m)     Head Circumference --      Peak Flow --      Pain Score --      Pain Loc --      Pain Edu? --      Excl. in GC? --     Constitutional: Alert and oriented. Well appearing and in no acute distress. Eyes: Conjunctivae are normal.  Head: Atraumatic. Nose: No congestion/rhinnorhea. Mouth/Throat: Mucous membranes are moist.  There is some swelling to the buccal mucosa and the cheeks.  The throat has a good patent airway.  The tongue is not swollen. Cardiovascular: Normal rate, regular rhythm.  Heart sounds are  normal Respiratory: Normal respiratory effort.  No retractions, lungs are clear to auscultation GU: deferred Musculoskeletal: FROM all extremities, warm and well perfused Neurologic:  Normal speech and language.  Skin:  Skin is warm, dry and intact. No rash noted.  No urticaria is noted Psychiatric: Mood and affect are normal. Speech and behavior are normal.  ____________________________________________   LABS (all labs ordered are listed, but only abnormal results are displayed)  Labs Reviewed - No data to display ____________________________________________   ____________________________________________  RADIOLOGY    ____________________________________________   PROCEDURES  Procedure(s) performed: Saline lock, normal saline 1 L bolus IV, Solu-Medrol 125 mg IV, Pepcid 20 mg IV, and Benadryl 50 mg IV  Procedures    ____________________________________________   INITIAL IMPRESSION / ASSESSMENT AND PLAN / ED COURSE  Pertinent labs & imaging results that were available during my care of the patient were reviewed by me and considered in my medical decision making (see chart for details).  Patient is a 37 year old female presents emergency department with an allergic reaction after eating shrimp and a baked potato at [employer].  She had a previous type reaction when she ate pepperoni.  This was resolved with steroids and Benadryl that she was given here in the emergency department.  She does have an EpiPen, though one in her purse is at home.  And the other is in the medicine cabinet at home.  She denies chest pain or shortness of breath at this time  On physical exam the patient's cheeks have swelling in the buccal mucosa, the tongue is not swollen, lungs are clear to auscultation  Saline lock, normal saline 1 L bolus IV, Pepcid 20 mg IV Solu-Medrol 125 mg IV, and Benadryl 50 mg IV     ----------------------------------------- 10:43 PM on  04/22/2017 -----------------------------------------  On reevaluation of the patient, her skin tone is much better, the cheeks are not swollen, and lungs are still clear to auscultation.  Patient states she feels much better.  Had a lengthy discussion with her about keeping the EpiPen close by.  She cannot leave this at home when she is going out to dinner.  She will need to keep one with her at work also.  Explained to her that with a food allergy that she may continue to have some reaction on and off as her body digest the rest of the food.  Explained her this can last up to 7 days.  Also cautioned her on eating any shrimp or pepperoni.  Spleen to her that this was a mild reaction but we do not know what the next reaction would be like.  She may  become so swollen that we are unable to revive her.  She states she understands.  She will follow-up with allergist as previously instructed.  She was discharged in stable condition  As part of my medical decision making, I reviewed the following data within the electronic MEDICAL RECORD NUMBER Nursing notes reviewed and incorporated, Old chart reviewed, Notes from prior ED visits and [place] Controlled Substance Database  ____________________________________________   FINAL CLINICAL IMPRESSION(S) / ED DIAGNOSES  Final diagnoses:  Acute allergic reaction, initial encounter      NEW MEDICATIONS STARTED DURING THIS VISIT:  New Prescriptions   PREDNISONE (STERAPRED UNI-PAK 21 TAB) 10 MG (21) TBPK TABLET    Take 6 pills on day one then decrease by 1 pill each day     Note:  This document was prepared using Dragon voice recognition software and may include unintentional dictation errors.    Faythe Ghee, PA-C 04/22/17 2244    Dionne Bucy, MD 04/23/17 [phone]

## 2017-05-03 ENCOUNTER — Other Ambulatory Visit: Payer: Self-pay

## 2017-05-03 ENCOUNTER — Emergency Department
Admission: EM | Admit: 2017-05-03 | Discharge: 2017-05-03 | Disposition: A | Discharge status: Home / Self Care | Payer: BLUE CROSS/BLUE SHIELD | Attending: Emergency Medicine | Admitting: Emergency Medicine

## 2017-05-03 DIAGNOSIS — T7840XA Allergy, unspecified, initial encounter: Secondary | ICD-10-CM | POA: Diagnosis not present

## 2017-05-03 DIAGNOSIS — Z87891 Personal history of nicotine dependence: Secondary | ICD-10-CM | POA: Insufficient documentation

## 2017-05-03 MED ORDER — EPINEPHRINE 0.3 MG/0.3ML IJ SOAJ
0.3000 mg | Freq: Once | INTRAMUSCULAR | Status: AC
  Administered 2017-05-03: 0.3 mg via INTRAMUSCULAR
  Filled 2017-05-03: qty 0.3

## 2017-05-03 MED ORDER — METHYLPREDNISOLONE SODIUM SUCC 125 MG IJ SOLR
125.0000 mg | Freq: Once | INTRAMUSCULAR | Status: AC
  Administered 2017-05-03: 125 mg via INTRAVENOUS
  Filled 2017-05-03: qty 2

## 2017-05-03 MED ORDER — ONDANSETRON HCL 4 MG/2ML IJ SOLN
4.0000 mg | Freq: Once | INTRAMUSCULAR | Status: DC
  Filled 2017-05-03: qty 2

## 2017-05-03 MED ORDER — FAMOTIDINE IN NACL 20-0.9 MG/50ML-% IV SOLN
20.0000 mg | Freq: Once | INTRAVENOUS | Status: AC
  Administered 2017-05-03: 20 mg via INTRAVENOUS
  Filled 2017-05-03: qty 50

## 2017-05-03 MED ORDER — EPINEPHRINE 0.3 MG/0.3ML IJ SOAJ: 0.3000 mg | Freq: Once | INTRAMUSCULAR | 1 refills | Status: AC

## 2017-05-03 MED ORDER — PREDNISONE 20 MG PO TABS: 60.0000 mg | ORAL_TABLET | Freq: Every day | ORAL | 0 refills | Status: AC

## 2017-05-03 NOTE — ED Notes (Signed)
Reviewed discharge instructions, follow-up care, and prescriptions with patient. Patient verbalized understanding of all information reviewed. Patient stable, with no distress noted at this time.    

## 2017-05-03 NOTE — ED Triage Notes (Signed)
Pt states she had an allergic reaction, gave herself and epipen. Pt states she continues to feel sensation of "something stuck in my throat". Pt with patent airway noted, no obvious posterior pharynx airway swelling on exam, no angioedema noted. Pt appears very anxious.

## 2017-05-03 NOTE — ED Notes (Signed)
Pt's spouse out to desk asking how much longer will pt be in emergency department. Pt's spouse states pt would like to leave. This rn has gone over extensively with pt approx time in ed during treatment for allergic reaction. Pt had previously verbalized understanding.

## 2017-05-03 NOTE — ED Notes (Addendum)
Per dr. Don PerkingVeronese, on reexam, pt states she feels like her throat is closing up. Per dr. Don PerkingVeronese, pt does not appear to be having an acute allergic reaction, however pt requesting additional epi.

## 2017-05-03 NOTE — ED Notes (Signed)
Pt continues to states she feels like something is "in my throat". Pt with clear speech, no stridor noted when pt's breathes when speaking with rn, however when attempting to auscultate breath sounds, pt appears to attempt to produce stridor. Clear breath sounds noted, no hives, rash, diaphoresis, swelling or angioedema noted. Pt appears in no acute distress, texting on phone.

## 2017-05-03 NOTE — ED Provider Notes (Signed)
[facility] Emergency Department Provider Note  ____________________________________________  Time seen: Approximately 8:12 PM  I have reviewed the triage vital signs and the nursing notes.   HISTORY  Chief Complaint Allergic Reaction   HPI Carla Daniels is a 37 y.o. female with a history of several prior visits for allergic reaction who presents for evaluation of an allergic reaction.  Patient reports that she had a cheeseburger and fries and right after that started having difficulty breathing.  She felt like her throat was closing up.  She stopped at a rest stop and went into the bathroom.  She felt dizzy, nauseous, and had one episode of NBNB emesis. She self administer 1 EpiPen.  At this time she says that she feels improved however still feels like there is something in her throat.  She has no stridor, no hives, no difficulty breathing or swallowing, no angioedema.  Patient reports that her prior allergic reactions were 2 different things including shrimp and pepperoni.  She has not seen an allergist.  At this time she has an appointment in a month with an allergist.  Chief Complaint: allergic reaction Severity: severe Duration: 1 hour Context: after eating cheeseburger and fries Associated signs/symptoms: Nausea and vomiting.   Past Medical History:  Diagnosis Date  . Iron deficiency     There are no active problems to display for this patient.   Past Surgical History:  Procedure Laterality Date  . TONSILLECTOMY      Prior to Admission medications   Medication Sig Start Date End Date Taking? Authorizing Provider  Ascorbic Acid (VITAMIN C ADULT GUMMIES PO) Take 1 Piece by mouth daily.    [provider]  azithromycin (ZITHROMAX Z-PAK) 250 MG tablet Take 2 tablets (500 mg) on  Day 1,  followed by 1 tablet (250 mg) once daily on Days 2 through 5. Patient not taking: Reported on 04/15/2017 04/25/15   Evangeline DakinBeers, Charles M, PA-C    EPINEPHrine 0.3 mg/0.3 mL IJ SOAJ injection Inject 0.3 mLs (0.3 mg total) into the muscle once for 1 dose. 05/03/17 05/03/17  Nita SickleVeronese, [place], MD  guaiFENesin-codeine 100-10 MG/5ML syrup Take 10 mLs by mouth every 4 (four) hours as needed for cough. Patient not taking: Reported on 04/15/2017 04/25/15   Evangeline DakinBeers, Charles M, PA-C  ibuprofen (ADVIL,MOTRIN) 200 MG tablet Take 200-400 mg by mouth every 6 (six) hours as needed.    [provider]  Melatonin 5 MG TABS Take 5 mg by mouth at bedtime as needed.    [provider]  predniSONE (DELTASONE) 20 MG tablet Take 3 tablets (60 mg total) by mouth daily for 4 days. 05/03/17 05/07/17  Nita SickleVeronese, [place], MD  pseudoephedrine (SUDAFED) 60 MG tablet Take 1 tablet (60 mg total) by mouth every 4 (four) hours as needed for congestion. Patient not taking: Reported on 04/15/2017 04/25/15   Evangeline DakinBeers, Charles M, PA-C  traMADol (ULTRAM) 50 MG tablet Take 1 tablet (50 mg total) by mouth every 6 (six) hours as needed. Patient not taking: Reported on 04/15/2017 12/10/15   Rebecka ApleyWebster, Allison P, MD    Allergies Patient has no known allergies.  No family history on file.  Social History Social History   Tobacco Use  . Smoking status: Former Games developermoker  . Smokeless tobacco: Never Used  Substance Use Topics  . Alcohol use: Yes    Comment: occas  . Drug use: No    Review of Systems  Constitutional: Negative for fever. Eyes: Negative for visual  changes. ENT: Negative for sore throat. + throat closing Neck: No neck pain  Cardiovascular: Negative for chest pain. Respiratory: Negative for shortness of breath. Gastrointestinal: Negative for abdominal pain, diarrhea. + N/V Genitourinary: Negative for dysuria. Musculoskeletal: Negative for back pain. Skin: Negative for rash. Neurological: Negative for headaches, weakness or numbness. Psych: No SI or HI  ____________________________________________   PHYSICAL EXAM:  VITAL SIGNS: ED Triage Vitals  [05/03/17 2006]  Enc Vitals Group     BP (!) 155/116     Pulse Rate 95     Resp (!) 22     Temp 98.3 F (36.8 C)     Temp Source Oral     SpO2 100 %     Weight (!) 382 lb (173.3 kg)     Height 5\' 6"  (1.676 m)     Head Circumference      Peak Flow      Pain Score 7     Pain Loc      Pain Edu?      Excl. in GC?     Constitutional: Alert and oriented, extremely anxious appearing but no distress.  HEENT:      Head: Normocephalic and atraumatic.         Eyes: Conjunctivae are normal. Sclera is non-icteric.       Mouth/Throat: Mucous membranes are moist.  No angioedema, no swelling of the tongue, uvula is midline with no swelling, airways patent, no stridor      Neck: Supple with no signs of meningismus. Cardiovascular: Regular rate and rhythm. No murmurs, gallops, or rubs. 2+ symmetrical distal pulses are present in all extremities. No JVD. Respiratory: Normal respiratory effort. Lungs are clear to auscultation bilaterally. No wheezes, crackles, or rhonchi.  Gastrointestinal: Soft, non tender, and non distended with positive bowel sounds. No rebound or guarding. Musculoskeletal: Nontender with normal range of motion in all extremities. No edema, cyanosis, or erythema of extremities. Neurologic: Normal speech and language. Face is symmetric. Moving all extremities. No gross focal neurologic deficits are appreciated. Skin: Skin is warm, dry and intact. No rash noted. Psychiatric: Mood and affect are normal. Speech and behavior are normal.  ____________________________________________   LABS (all labs ordered are listed, but only abnormal results are displayed)  Labs Reviewed - No data to display ____________________________________________  EKG  ED ECG REPORT I, Nita Sickle, the attending physician, personally viewed and interpreted this ECG.  Normal sinus rhythm, rate of 94, normal intervals, normal axis, no ST elevations or depressions.  No prior for  comparison. ____________________________________________  RADIOLOGY  none  ____________________________________________   PROCEDURES  Procedure(s) performed: None Procedures Critical Care performed:  None ____________________________________________   INITIAL IMPRESSION / ASSESSMENT AND PLAN / ED COURSE  37 y.o. female with a history of several prior visits for allergic reaction who presents for evaluation of an allergic reaction including throat closing sensation, nausea and vomiting.  Patient has had several similar reactions in the past to an unknown allergen.  She is self administer an EpiPen.  At this time there is no hives, angioedema, swelling, stridor.  Airway is patent.  Will give Solu-Medrol, Pepcid and monitor patient.    _________________________ 11:06 PM on 05/03/2017 -----------------------------------------  Patient monitored for several hours with no evidence of anaphylaxis.  She feels markedly improved.  She has an EpiPen at home.  She is going to be given a prescription for 2 more in 4 more days of steroids.  Discussed return precautions with patient.  Discussed the  importance of following up with an allergist.  Recommended that she contact [facility], [facility], and [facility] allergy clinics for the appointment.   As part of my medical decision making, I reviewed the following data within the electronic MEDICAL RECORD NUMBER Nursing notes reviewed and incorporated, Notes from prior ED visits and [place] Controlled Substance Database    Pertinent labs & imaging results that were available during my care of the patient were reviewed by me and considered in my medical decision making (see chart for details).    ____________________________________________   FINAL CLINICAL IMPRESSION(S) / ED DIAGNOSES  Final diagnoses:  Allergic reaction, initial encounter      NEW MEDICATIONS STARTED DURING THIS VISIT:  ED Discharge Orders        Ordered    EPINEPHrine 0.3 mg/0.3 mL IJ SOAJ  injection   Once     05/03/17 2305    predniSONE (DELTASONE) 20 MG tablet  Daily     05/03/17 2305       Note:  This document was prepared using Dragon voice recognition software and may include unintentional dictation errors.    Nita Sickle, MD 05/03/17 2306

## 2017-05-03 NOTE — ED Notes (Signed)
Pt appears in no acute distress, no angioedema noted. Pox 98% on ra. Clear breath sounds. Pt appears to be attempting to make herself appear stridorous. Pt has clear speech, no stridor noted during speaking, but then when attempting to auscultate pt appears to be attempting to produce stridorous breath sounds.

## 2017-10-13 ENCOUNTER — Encounter: Payer: Self-pay | Admitting: Emergency Medicine

## 2017-10-13 ENCOUNTER — Emergency Department: Payer: BLUE CROSS/BLUE SHIELD

## 2017-10-13 ENCOUNTER — Other Ambulatory Visit: Payer: Self-pay

## 2017-10-13 ENCOUNTER — Emergency Department
Admission: EM | Admit: 2017-10-13 | Discharge: 2017-10-13 | Disposition: A | Discharge status: Home / Self Care | Payer: BLUE CROSS/BLUE SHIELD | Attending: Emergency Medicine | Admitting: Emergency Medicine

## 2017-10-13 DIAGNOSIS — Y939 Activity, unspecified: Secondary | ICD-10-CM | POA: Insufficient documentation

## 2017-10-13 DIAGNOSIS — Y999 Unspecified external cause status: Secondary | ICD-10-CM | POA: Insufficient documentation

## 2017-10-13 DIAGNOSIS — Z87891 Personal history of nicotine dependence: Secondary | ICD-10-CM | POA: Insufficient documentation

## 2017-10-13 DIAGNOSIS — S92515A Nondisplaced fracture of proximal phalanx of left lesser toe(s), initial encounter for closed fracture: Secondary | ICD-10-CM | POA: Diagnosis not present

## 2017-10-13 DIAGNOSIS — S99922A Unspecified injury of left foot, initial encounter: Secondary | ICD-10-CM | POA: Diagnosis present

## 2017-10-13 DIAGNOSIS — W2203XA Walked into furniture, initial encounter: Secondary | ICD-10-CM | POA: Insufficient documentation

## 2017-10-13 DIAGNOSIS — Y92009 Unspecified place in unspecified non-institutional (private) residence as the place of occurrence of the external cause: Secondary | ICD-10-CM | POA: Insufficient documentation

## 2017-10-13 MED ORDER — MELOXICAM 15 MG PO TABS: 15.0000 mg | ORAL_TABLET | Freq: Every day | ORAL | 1 refills | Status: AC

## 2017-10-13 MED ORDER — HYDROCODONE-ACETAMINOPHEN 5-325 MG PO TABS: 1.0000 | ORAL_TABLET | Freq: Four times a day (QID) | ORAL | 0 refills | Status: AC | PRN

## 2017-10-13 MED ORDER — HYDROCODONE-ACETAMINOPHEN 5-325 MG PO TABS
1.0000 | ORAL_TABLET | Freq: Once | ORAL | Status: AC
  Administered 2017-10-13: 1 via ORAL
  Filled 2017-10-13: qty 1

## 2017-10-13 NOTE — ED Triage Notes (Signed)
Patient stubbed her left pinky toe on Saturday night and states the pain as gotten bad and has bruising across foot now.

## 2017-10-13 NOTE — ED Provider Notes (Signed)
[facility] Emergency Department Provider Note  ____________________________________________  Time seen: Approximately 10:53 PM  I have reviewed the triage vital signs and the nursing notes.   HISTORY  Chief Complaint Toe Injury    HPI Carla Daniels is a 37 y.o. female presents to the emergency department with constant 8 out of 10 aching acute and nonradiating left fifth toe pain after patient stubbed her foot 2 days ago.  Patient has noticed bruising and increased pain and became concerned.  Patient has tried ibuprofen.  No lacerations or abrasions.   Past Medical History:  Diagnosis Date  . Iron deficiency     There are no active problems to display for this patient.   Past Surgical History:  Procedure Laterality Date  . TONSILLECTOMY      Prior to Admission medications   Medication Sig Start Date End Date Taking? Authorizing Provider  Ascorbic Acid (VITAMIN C ADULT GUMMIES PO) Take 1 Piece by mouth daily.    [provider]  azithromycin (ZITHROMAX Z-PAK) 250 MG tablet Take 2 tablets (500 mg) on  Day 1,  followed by 1 tablet (250 mg) once daily on Days 2 through 5. Patient not taking: Reported on 04/15/2017 04/25/15   Evangeline Dakin, PA-C  guaiFENesin-codeine 100-10 MG/5ML syrup Take 10 mLs by mouth every 4 (four) hours as needed for cough. Patient not taking: Reported on 04/15/2017 04/25/15   Beers, Charmayne Sheer, PA-C  HYDROcodone-acetaminophen (NORCO) 5-325 MG tablet Take 1 tablet by mouth every 6 (six) hours as needed for up to 3 days for moderate pain. 10/13/17 10/16/17  Orvil Feil, PA-C  ibuprofen (ADVIL,MOTRIN) 200 MG tablet Take 200-400 mg by mouth every 6 (six) hours as needed.    [provider]  Melatonin 5 MG TABS Take 5 mg by mouth at bedtime as needed.    [provider]  meloxicam (MOBIC) 15 MG tablet Take 1 tablet (15 mg total) by mouth daily for 7 days. 10/13/17 10/20/17  Orvil Feil, PA-C   pseudoephedrine (SUDAFED) 60 MG tablet Take 1 tablet (60 mg total) by mouth every 4 (four) hours as needed for congestion. Patient not taking: Reported on 04/15/2017 04/25/15   Evangeline Dakin, PA-C  traMADol (ULTRAM) 50 MG tablet Take 1 tablet (50 mg total) by mouth every 6 (six) hours as needed. Patient not taking: Reported on 04/15/2017 12/10/15   Rebecka Apley, MD    Allergies Soy allergy; Milk-related compounds; and Tramadol  No family history on file.  Social History Social History   Tobacco Use  . Smoking status: Former Games developer  . Smokeless tobacco: Never Used  Substance Use Topics  . Alcohol use: Yes    Comment: occas  . Drug use: No     Review of Systems  Constitutional: No fever/chills Eyes: No visual changes. No discharge ENT: No upper respiratory complaints. Cardiovascular: no chest pain. Respiratory: no cough. No SOB. Gastrointestinal: No abdominal pain.  No nausea, no vomiting.  No diarrhea.  No constipation. Musculoskeletal: Patient has foot pain.  Skin: Negative for rash, abrasions, lacerations, ecchymosis. Neurological: Negative for headaches, focal weakness or numbness.   ____________________________________________   PHYSICAL EXAM:  VITAL SIGNS: ED Triage Vitals  Enc Vitals Group     BP 10/13/17 2025 (!) 156/90     Pulse Rate 10/13/17 2025 100     Resp 10/13/17 2025 19     Temp 10/13/17 2025 97.9 F (36.6 C)     Temp Source  10/13/17 2025 Oral     SpO2 10/13/17 2025 97 %     Weight 10/13/17 2026 (!) 353 lb (160.1 kg)     Height 10/13/17 2026 5\' 6"  (1.676 m)     Head Circumference --      Peak Flow --      Pain Score 10/13/17 2025 7     Pain Loc --      Pain Edu? --      Excl. in GC? --      Constitutional: Alert and oriented. Well appearing and in no acute distress. Eyes: Conjunctivae are normal. PERRL. EOMI. Head: Atraumatic. Cardiovascular: Normal rate, regular rhythm. Normal S1 and S2.  Good peripheral circulation. Respiratory:  Normal respiratory effort without tachypnea or retractions. Lungs CTAB. Good air entry to the bases with no decreased or absent breath sounds. Musculoskeletal: Patient is able to perform full range of motion of the left ankle.  She is able to move all 5 left toes.  Patient has pain with palpation over the proximal left fifth phalanx.  Palpable radial pulse, left.  Capillary refill is less than 2 seconds. Neurologic:  Normal speech and language. No gross focal neurologic deficits are appreciated.  Skin:  Skin is warm, dry and intact. No rash noted. Psychiatric: Mood and affect are normal. Speech and behavior are normal. Patient exhibits appropriate insight and judgement.   ____________________________________________   LABS (all labs ordered are listed, but only abnormal results are displayed)  Labs Reviewed - No data to display ____________________________________________  EKG   ____________________________________________  RADIOLOGY I personally viewed and evaluated these images as part of my medical decision making, as well as reviewing the written report by the radiologist.  Dg Foot Complete Left  Result Date: 10/13/2017 CLINICAL DATA:  Stubbed little toe on Saturday night. Progressive pain and bruising. EXAM: LEFT FOOT - COMPLETE 3+ VIEW COMPARISON:  None. FINDINGS: There is an obliquely oriented nondisplaced fracture along the shaft of the fifth proximal phalanx. The fracture fragments are in anatomic alignment. No dislocation. IMPRESSION: 1. Acute oblique fracture deformity extends along the shaft of the fifth proximal phalanx. Electronically Signed   By: Signa Kell M.D.   On: 10/13/2017 20:56    ____________________________________________    PROCEDURES  Procedure(s) performed:    Procedures    Medications  HYDROcodone-acetaminophen (NORCO/VICODIN) 5-325 MG per tablet 1 tablet (1 tablet Oral Given 10/13/17 2235)      ____________________________________________   INITIAL IMPRESSION / ASSESSMENT AND PLAN / ED COURSE  Pertinent labs & imaging results that were available during my care of the patient were reviewed by me and considered in my medical decision making (see chart for details).  Review of the [place] CSRS was performed in accordance of the [facility] prior to dispensing any controlled drugs.    Assessment and plan Phalanx fracture Patient presents to the emergency department with left fifth toe pain.  X-ray examination is concerning for an oblique fracture of the left proximal phalanx.  Patient was discharged with Norco and meloxicam for pain and advised to follow-up with podiatry.  All patient questions were answered.    ____________________________________________  FINAL CLINICAL IMPRESSION(S) / ED DIAGNOSES  Final diagnoses:  Closed nondisplaced fracture of proximal phalanx of lesser toe of left foot, initial encounter      NEW MEDICATIONS STARTED DURING THIS VISIT:  ED Discharge Orders         Ordered    HYDROcodone-acetaminophen (NORCO) 5-325 MG tablet  Every 6 hours PRN  10/13/17 2222    meloxicam (MOBIC) 15 MG tablet  Daily     10/13/17 2222              This chart was dictated using voice recognition software/Dragon. Despite best efforts to proofread, errors can occur which can change the meaning. Any change was purely unintentional.    Orvil Feil, PA-C 10/13/17 2257    Don Perking, [place], MD 10/14/17 [phone]

## 2017-10-29 ENCOUNTER — Encounter: Payer: Self-pay | Admitting: Emergency Medicine

## 2017-10-29 ENCOUNTER — Emergency Department: Payer: BLUE CROSS/BLUE SHIELD

## 2017-10-29 ENCOUNTER — Emergency Department
Admission: EM | Admit: 2017-10-29 | Discharge: 2017-10-29 | Disposition: A | Discharge status: Home / Self Care | Payer: BLUE CROSS/BLUE SHIELD | Attending: Emergency Medicine | Admitting: Emergency Medicine

## 2017-10-29 DIAGNOSIS — R079 Chest pain, unspecified: Secondary | ICD-10-CM | POA: Insufficient documentation

## 2017-10-29 DIAGNOSIS — R0602 Shortness of breath: Secondary | ICD-10-CM | POA: Diagnosis not present

## 2017-10-29 DIAGNOSIS — Z87891 Personal history of nicotine dependence: Secondary | ICD-10-CM | POA: Insufficient documentation

## 2017-10-29 DIAGNOSIS — R0789 Other chest pain: Secondary | ICD-10-CM

## 2017-10-29 LAB — BASIC METABOLIC PANEL
Anion gap: 7 (ref 5–15)
BUN: 9 mg/dL (ref 6–20)
CALCIUM: 9 mg/dL (ref 8.9–10.3)
CO2: 28 mmol/L (ref 22–32)
CREATININE: 0.7 mg/dL (ref 0.44–1.00)
Chloride: 102 mmol/L (ref 98–111)
GFR calc Af Amer: >60 mL/min (ref >60)
GFR calc non Af Amer: >60 mL/min (ref >60)
GLUCOSE: 214 mg/dL — AB (ref 70–99)
Potassium: 3.7 mmol/L (ref 3.5–5.1)
Sodium: 137 mmol/L (ref 135–145)

## 2017-10-29 LAB — FIBRIN DERIVATIVES D-DIMER (ARMC ONLY): Fibrin derivatives D-dimer (ARMC): 683.37 ng/mL (FEU) — ABNORMAL HIGH (ref 0.00–499.00)

## 2017-10-29 LAB — CBC
HCT: 37.1 % (ref 35.0–47.0)
Hemoglobin: 12.4 g/dL (ref 12.0–16.0)
MCH: 26.5 pg (ref 26.0–34.0)
MCHC: 33.6 g/dL (ref 32.0–36.0)
MCV: 79.1 fL — ABNORMAL LOW (ref 80.0–100.0)
PLATELETS: 273 10*3/uL (ref 150–440)
RBC: 4.69 MIL/uL (ref 3.80–5.20)
RDW: 14.7 % — ABNORMAL HIGH (ref 11.5–14.5)
WBC: 4.5 10*3/uL (ref 3.6–11.0)

## 2017-10-29 LAB — POCT PREGNANCY, URINE: Preg Test, Ur: NEGATIVE

## 2017-10-29 LAB — TROPONIN I: Troponin I: <0.03 ng/mL (ref <0.03)

## 2017-10-29 MED ORDER — CYCLOBENZAPRINE HCL 10 MG PO TABS
5.0000 mg | ORAL_TABLET | Freq: Once | ORAL | Status: AC
  Administered 2017-10-29: 5 mg via ORAL
  Filled 2017-10-29: qty 1

## 2017-10-29 MED ORDER — CYCLOBENZAPRINE HCL 5 MG PO TABS: 5.0000 mg | ORAL_TABLET | Freq: Three times a day (TID) | ORAL | 0 refills | Status: AC | PRN

## 2017-10-29 MED ORDER — IBUPROFEN 800 MG PO TABS: 800.0000 mg | ORAL_TABLET | Freq: Four times a day (QID) | ORAL | 0 refills | Status: DC | PRN

## 2017-10-29 MED ORDER — KETOROLAC TROMETHAMINE 30 MG/ML IJ SOLN
30.0000 mg | Freq: Once | INTRAMUSCULAR | Status: AC
  Administered 2017-10-29: 30 mg via INTRAVENOUS
  Filled 2017-10-29: qty 1

## 2017-10-29 MED ORDER — IOHEXOL 350 MG/ML SOLN
75.0000 mL | Freq: Once | INTRAVENOUS | Status: AC | PRN
  Administered 2017-10-29: 75 mL via INTRAVENOUS

## 2017-10-29 NOTE — Discharge Instructions (Addendum)
Take motrin for pain.   Take flexeril for muscle spasms. You likely have chest wall pain. No heavy lifting for 2 days   See your doctor  Return to ER if you have worse chest pain, trouble breathing

## 2017-10-29 NOTE — ED Provider Notes (Signed)
[facility] EMERGENCY DEPARTMENT Provider Note   CSN: [id number] Arrival date & time: 10/29/17  9528     History   Chief Complaint Chief Complaint  Patient presents with  . Chest Pain    HPI Carla Daniels is a 37 y.o. female history of iron deficiency here presenting with chest pain, shortness of breath.  Patient had a recent left big toe fracture that was diagnosed in the ED.  Patient noted acute onset of chest pain shortness of breath at 9:25 AM.  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  She did not lift anything heavy today or yesterday but did lift something heavy a day before.  She states that the pain is pleuritic and worse with movement.  She has some subjective shortness of breath but denies any recent travel or leg swelling or history of blood clots.  Patient denies any history of coronary artery disease or stents in her heart.   The history is provided by the patient.    Past Medical History:  Diagnosis Date  . Iron deficiency     There are no active problems to display for this patient.   Past Surgical History:  Procedure Laterality Date  . TONSILLECTOMY       OB History   None      Home Medications    Prior to Admission medications   Medication Sig Start Date End Date Taking? Authorizing Provider  Ascorbic Acid (VITAMIN C ADULT GUMMIES PO) Take 1 Piece by mouth daily.   Yes [provider]  ibuprofen (ADVIL,MOTRIN) 200 MG tablet Take 200-400 mg by mouth every 6 (six) hours as needed for moderate pain.    Yes [provider]  Melatonin 5 MG TABS Take 5 mg by mouth at bedtime as needed (sleep).    Yes [provider]  azithromycin (ZITHROMAX Z-PAK) 250 MG tablet Take 2 tablets (500 mg) on  Day 1,  followed by 1 tablet (250 mg) once daily on Days 2 through 5. Patient not taking: Reported on 04/15/2017 04/25/15   Evangeline Dakin, PA-C  guaiFENesin-codeine 100-10 MG/5ML syrup Take 10 mLs  by mouth every 4 (four) hours as needed for cough. Patient not taking: Reported on 04/15/2017 04/25/15   Beers, Charmayne Sheer, PA-C  pseudoephedrine (SUDAFED) 60 MG tablet Take 1 tablet (60 mg total) by mouth every 4 (four) hours as needed for congestion. Patient not taking: Reported on 04/15/2017 04/25/15   Evangeline Dakin, PA-C  traMADol (ULTRAM) 50 MG tablet Take 1 tablet (50 mg total) by mouth every 6 (six) hours as needed. Patient not taking: Reported on 04/15/2017 12/10/15   Rebecka Apley, MD    Family History No family history on file.  Social History Social History   Tobacco Use  . Smoking status: Former Games developer  . Smokeless tobacco: Never Used  Substance Use Topics  . Alcohol use: Yes    Comment: occas  . Drug use: No     Allergies   Soy allergy; Amoxicillin; Milk-related compounds; and Tramadol   Review of Systems Review of Systems  Respiratory: Positive for shortness of breath.   Cardiovascular: Positive for chest pain.  All other systems reviewed and are negative.    Physical Exam Updated Vital Signs BP 124/72   Pulse 71   Resp 19   Ht 5\' 6"  (1.676 m)   Wt (!) 160 kg   LMP 09/29/2017 (Approximate) Comment: neg preg test 9/25  SpO2  96%   BMI 56.94 kg/m   Physical Exam  Constitutional: She is oriented to person, place, and time. She appears well-developed and well-nourished.  HENT:  Head: Normocephalic.  Eyes: Pupils are equal, round, and reactive to light.  Neck: Normal range of motion.  Cardiovascular: Normal rate, regular rhythm and normal pulses.  Pulmonary/Chest: Effort normal and breath sounds normal.  Slightly uncomfortable, reproducible sternal tenderness   Abdominal: Soft. Bowel sounds are normal.  Musculoskeletal: Normal range of motion.       Right lower leg: Normal.       Left lower leg: Normal.  No obvious calf tenderness. L big toe slightly tender. 2+ pulses bilateral lower extremities   Neurological: She is alert and oriented to  person, place, and time.  Skin: Skin is warm. Capillary refill takes less than 2 seconds.  Psychiatric: She has a normal mood and affect. Her behavior is normal.  Nursing note and vitals reviewed.    ED Treatments / Results  Labs (all labs ordered are listed, but only abnormal results are displayed) Labs Reviewed  BASIC METABOLIC PANEL - Abnormal; Notable for the following components:      Result Value   Glucose, Bld 214 (*)    All other components within normal limits  CBC - Abnormal; Notable for the following components:   MCV 79.1 (*)    RDW 14.7 (*)    All other components within normal limits  FIBRIN DERIVATIVES D-DIMER ([facility] ONLY) - Abnormal; Notable for the following components:   Fibrin derivatives D-dimer [facility]) 683.37 (*)    All other components within normal limits  TROPONIN I  TROPONIN I  POC URINE PREG, ED  POCT PREGNANCY, URINE    EKG EKG Interpretation  Date/Time:  Wednesday October 29 2017 09:30:07 EDT Ventricular Rate:  79 PR Interval:  152 QRS Duration: 96 QT Interval:  360 QTC Calculation: 412 R Axis:   1 Text Interpretation:  Normal sinus rhythm Normal ECG When compared with ECG of 03-May-2017 20:01, PREVIOUS ECG IS PRESENT Confirmed by Richardean Canal [phone]) on 10/29/2017 11:16:40 AM   Radiology Dg Chest 2 View  Result Date: 10/29/2017 CLINICAL DATA:  Shortness of breath and chest pain EXAM: CHEST - 2 VIEW COMPARISON:  April 25, 2015 FINDINGS: There is no edema or consolidation. Heart is upper normal in size with pulmonary vascularity normal. No adenopathy. No pneumothorax. No bone lesions. IMPRESSION: No edema or consolidation.  Stable cardiac silhouette. Electronically Signed   By: Bretta Bang III M.D.   On: 10/29/2017 10:24   Ct Angio Chest Pe W And/or Wo Contrast  Result Date: 10/29/2017 CLINICAL DATA:  Chest pain and shortness of breath EXAM: CT ANGIOGRAPHY CHEST WITH CONTRAST TECHNIQUE: Multidetector CT imaging of the chest was performed  using the standard protocol during bolus administration of intravenous contrast. Multiplanar CT image reconstructions and MIPs were obtained to evaluate the vascular anatomy. CONTRAST:  75mL OMNIPAQUE IOHEXOL 350 MG/ML SOLN COMPARISON:  Chest radiograph October 29, 2017 FINDINGS: Cardiovascular: There is no demonstrable pulmonary embolus. There is no thoracic aortic aneurysm or dissection. Visualized great vessels appear unremarkable. The right innominate and left common carotid arteries arise as a common trunk, an anatomic variant. There is no appreciable pericardial effusion or pericardial thickening. Mediastinum/Nodes: Visualized thyroid appears unremarkable. There is no appreciable thoracic adenopathy. No esophageal lesions are evident. Lungs/Pleura: Lungs are clear. No edema or consolidation. No pleural effusion or pleural thickening evident. Upper Abdomen: Spleen is incompletely visualized. Spleen measures 17.5  cm in length, enlarged. Liver also appears prominent although incompletely visualized. Visualized upper abdominal structures otherwise appear unremarkable. Musculoskeletal: There are no blastic or lytic bone lesions. There are no evident chest wall lesions. Review of the MIP images confirms the above findings. IMPRESSION: 1. No demonstrable pulmonary embolus. No thoracic aortic aneurysm or dissection. 2.  Lungs clear. 3.  No evident adenopathy. 4. Spleen appears enlarged although incompletely visualized. Liver also appears prominent although incompletely visualized. Electronically Signed   By: Bretta Bang III M.D.   On: 10/29/2017 13:47    Procedures Procedures (including critical care time)  Medications Ordered in ED Medications  ketorolac (TORADOL) 30 MG/ML injection 30 mg (30 mg Intravenous Given 10/29/17 1150)  cyclobenzaprine (FLEXERIL) tablet 5 mg (5 mg Oral Given 10/29/17 1150)  iohexol (OMNIPAQUE) 350 MG/ML injection 75 mL (75 mLs Intravenous Contrast Given 10/29/17 1332)      Initial Impression / Assessment and Plan / ED Course  I have reviewed the triage vital signs and the nursing notes.  Pertinent labs & imaging results that were available during my care of the patient were reviewed by me and considered in my medical decision making (see chart for details).     KATERINA ZURN is a 37 y.o. female here with SOB, chest pain. Pain is reproducible. Likely muscle strain. However, given recent L foot fracture, will get d-dimer. No obvious calf tenderness or calf pain however, so my suspicion for PE is low. Low suspicion for ACS as well. Will get labs, trop x 2, CXR, d-dimer. Will give toradol, flexeril and reassess.   3:13 PM D-dimer elevated at 680. CTA negative. Trop neg x 2. Pain improved with toradol, flexeril. Will dc home with flexeril, motrin. Likely chest wall pain    Final Clinical Impressions(s) / ED Diagnoses   Final diagnoses:  None    ED Discharge Orders    None       Charlynne Pander, MD 10/29/17 [phone]

## 2017-10-29 NOTE — ED Triage Notes (Signed)
Patient presents to the ED with mid-sternal chest pain that began around 8:25am.  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  Patient states, "it wasn't anything heavy though."  Patient states she is also feeling slightly short of breath.  Patient denies nausea and vomiting, denies diaphoresis.  Patient speaking in full sentences without obvious difficulty.  Patient states, "I have a broke toe and I've been in a lot of pain, I was thinking maybe that's what's causing this."

## 2017-10-29 NOTE — ED Notes (Signed)
First Nurse Note: Patient to Rm. 9 via WC.  Morrie Sheldon RN aware of room placement.

## 2018-07-24 ENCOUNTER — Emergency Department
Admission: EM | Admit: 2018-07-24 | Discharge: 2018-07-24 | Disposition: A | Discharge status: Home / Self Care | Payer: BC Managed Care – PPO | Attending: Emergency Medicine | Admitting: Emergency Medicine

## 2018-07-24 DIAGNOSIS — Z79899 Other long term (current) drug therapy: Secondary | ICD-10-CM | POA: Insufficient documentation

## 2018-07-24 DIAGNOSIS — K0889 Other specified disorders of teeth and supporting structures: Secondary | ICD-10-CM | POA: Insufficient documentation

## 2018-07-24 DIAGNOSIS — K0381 Cracked tooth: Secondary | ICD-10-CM | POA: Diagnosis not present

## 2018-07-24 DIAGNOSIS — Z87891 Personal history of nicotine dependence: Secondary | ICD-10-CM | POA: Insufficient documentation

## 2018-07-24 MED ORDER — IBUPROFEN 600 MG PO TABS: 600.0000 mg | ORAL_TABLET | Freq: Three times a day (TID) | ORAL | 0 refills | Status: AC | PRN

## 2018-07-24 MED ORDER — CLINDAMYCIN HCL 150 MG PO CAPS: ORAL_CAPSULE | ORAL | 0 refills | Status: AC

## 2018-07-24 NOTE — Discharge Instructions (Signed)
Follow-up with 1 of the dental clinics listed on your discharge papers.  Begin taking antibiotics until completely finished.  Ibuprofen if needed for pain.  Call dental clinics today to see if you get a get an appointment the first of the week.  OPTIONS FOR DENTAL FOLLOW UP CARE  [place] [facility] and [place] [email or web address] [phone])  [facility] [phone])  [place] [phone])  [place] [phone])  [facility] [phone]) This clinic caters to the indigent population and is on a lottery system. Location: [employer] [facility], [place], [place], [place] [facility] Hours: Wednesdays from 6pm - 9pm, patients seen by a lottery system. For dates, call or go to [email or web address] Services: Cleanings, fillings and simple extractions. Payment Options: DENTAL WORK IS FREE OF CHARGE. Bring proof of income or support. Best way to get seen: Arrive at 5:15 pm - this is a lottery, NOT first come/first serve, so arriving earlier will not increase your chances of being seen.     [facility] [phone] Select option 1 for emergencies   Location: [facility], [place], [address] [place] [facility] Hours: No walk-ins accepted - call the day before to schedule an appointment. Check in times are 9:30 am and 1:30 pm. Services: Simple extractions, temporary fillings, pulpectomy/pulp debridement, uncomplicated abscess drainage. Payment Options: PAYMENT IS DUE AT THE TIME OF SERVICE.  Fee is usually $100-200, additional surgical procedures (e.g. abscess drainage) may be extra. Cash, checks, Visa/MasterCard accepted.  Can file Medicaid if patient is covered for dental - patient should call case worker to  check. No discount for [facility] patients. Best way to get seen: MUST call the day before and get onto the schedule. Can usually be seen the next 1-2 days. No walk-ins accepted.     [place] [phone]   Location: [place], [place] [facility] Hours: M, W, Th, F 8am or 1:30pm, Tues 9a or 1:30 - first come/first served. Services: Simple extractions, temporary fillings, uncomplicated abscess drainage.  You do not need to be an [facility] resident. Payment Options: PAYMENT IS DUE AT THE TIME OF SERVICE. Dental insurance, otherwise sliding scale - bring proof of income or support. Depending on income and treatment needed, cost is usually $50-200. Best way to get seen: Arrive early as it is first come/first served.     [facility] [phone]   Location: [place] [facility] Hours: Mon-Thu 8a-5p Services: Most basic dental services including extractions and fillings. Payment Options: PAYMENT IS DUE AT THE TIME OF SERVICE. Sliding scale, up to 50% off - bring proof if income or support. Medicaid with dental option accepted. Best way to get seen: Call to schedule an appointment, can usually be seen within 2 weeks OR they will try to see walk-ins - show up at [place] or 2p (you may have to wait).     [facility] [place] RESIDENTS ONLY   Location: [facility] [place] [address], [place], [place] [address] Clinic Hours: By appointment only. Monday - Thursday 8am-5pm, Friday 8am-12pm Services: Cleanings, fillings, extractions. Payment Options: PAYMENT IS DUE AT THE TIME OF SERVICE. Cash, Visa or MasterCard. Sliding scale - $30 minimum per service. Best way to get seen: Come in to office, complete packet and  make an appointment - need proof of income or support monies for each household member and proof of [facility] [place]  residence. Usually takes about a month to get in.     [facility] [phone]   Location: [address] [facility] Hours: Walk-in Urgent Care Dental Services are offered Monday-Friday mornings only. The numbers of emergencies accepted daily is limited to the number of providers available. Maximum 15 - Mondays, Wednesdays & Thursdays Maximum 10 - Tuesdays & Fridays Services: You do not need to be a [facility] [place] resident to be seen for a dental emergency. Emergencies are defined as pain, swelling, abnormal bleeding, or dental trauma. Walkins will receive x-rays if needed. NOTE: Dental cleaning is not an emergency. Payment Options: PAYMENT IS DUE AT THE TIME OF SERVICE. Minimum co-pay is $40.00 for uninsured patients. Minimum co-pay is $3.00 for Medicaid with dental coverage. Dental Insurance is accepted and must be presented at time of visit. Medicare does not cover dental. Forms of payment: Cash, credit card, checks. Best way to get seen: If not previously registered with the clinic, walk-in dental registration begins at 7:15 am and is on a first come/first serve basis. If previously registered with the clinic, call to make an appointment.     [facility] [phone] [place] RESIDENTS ONLY   Location: [address], [place], [place] Clinic Hours: Mon-Thu 10a-2p Services: Extractions only! Payment Options: FREE (donations accepted) - bring proof of income or support Best way to get seen: Call and schedule an appointment OR come at 8am on the 1st Monday of every month (except for holidays) when it is first come/first served.     [facility] [phone]   [address]: [address], [place] [facility] Hours: Friday mornings Services, Payment Options, Best way to get seen: Call for info

## 2018-07-24 NOTE — ED Provider Notes (Signed)
[facility] Emergency Department Provider Note   ____________________________________________   None    (approximate)  I have reviewed the triage vital signs and the nursing notes.   HISTORY  Chief Complaint Dental Pain   HPI Carla Daniels is a 38 y.o. female presents to the ED with complaint of dental pain to her upper left molar.  Patient states that pain has increased over the last 2 weeks.  Currently she does not have a dental appointment.  She rates her pain as a 10/10.       Past Medical History:  Diagnosis Date  . Iron deficiency     There are no active problems to display for this patient.   Past Surgical History:  Procedure Laterality Date  . ABDOMINAL SURGERY    . TONSILLECTOMY      Prior to Admission medications   Medication Sig Start Date End Date Taking? Authorizing Provider  Ascorbic Acid (VITAMIN C ADULT GUMMIES PO) Take 1 Piece by mouth daily.    [provider]  clindamycin (CLEOCIN) 150 MG capsule 2 capsules 3 times daily until finished 07/24/18   Bridget HartshornSummers,  L, PA-C  cyclobenzaprine (FLEXERIL) 5 MG tablet Take 1 tablet (5 mg total) by mouth 3 (three) times daily as needed for muscle spasms. 10/29/17   Charlynne PanderYao, David Hsienta, MD  ibuprofen (ADVIL) 600 MG tablet Take 1 tablet (600 mg total) by mouth every 8 (eight) hours as needed. 07/24/18   Tommi RumpsSummers,  L, PA-C  Melatonin 5 MG TABS Take 5 mg by mouth at bedtime as needed (sleep).     [provider]    Allergies Soy allergy, Amoxicillin, Milk-related compounds, and Tramadol  No family history on file.  Social History Social History   Tobacco Use  . Smoking status: Former Games developermoker  . Smokeless tobacco: Never Used  Substance Use Topics  . Alcohol use: Yes    Comment: occas  . Drug use: No    Review of Systems Constitutional: No fever/chills Eyes: No visual changes. ENT: No sore throat.  Positive dental pain. Cardiovascular: Denies chest  pain. Respiratory: Denies shortness of breath. Musculoskeletal: Negative for muscle aches. Neurological: Negative for headaches, focal weakness or numbness. ____________________________________________   PHYSICAL EXAM:  VITAL SIGNS: ED Triage Vitals  Enc Vitals Group     BP 07/24/18 0701 (!) 154/88     Pulse Rate 07/24/18 0701 70     Resp 07/24/18 0701 17     Temp 07/24/18 0701 98.7 F (37.1 C)     Temp src --      SpO2 07/24/18 0701 97 %     Weight 07/24/18 0659 (!) 358 lb (162.4 kg)     Height 07/24/18 0659 5\' 6"  (1.676 m)     Head Circumference --      Peak Flow --      Pain Score 07/24/18 0659 10     Pain Loc --      Pain Edu? --      Excl. in GC? --    Constitutional: Alert and oriented. Well appearing and in no acute distress. Eyes: Conjunctivae are normal.  Head: Atraumatic. Nose: No congestion/rhinnorhea. Mouth/Throat: Mucous membranes are moist.  Oropharynx non-erythematous.  Left upper molar with filling in place however the medial aspect of the tooth has broken off.  No obvious abscess or draining was noted. Neck: No stridor.   Cardiovascular: Normal rate, regular rhythm. Grossly normal heart sounds.  Good peripheral circulation. Respiratory: Normal  respiratory effort.  No retractions. Lungs CTAB. Musculoskeletal: Moves upper and lower extremities without any difficulty.  Normal gait was noted. Neurologic:  Normal speech and language. No gross focal neurologic deficits are appreciated. No gait instability. Skin:  Skin is warm, dry and intact. No rash noted. Psychiatric: Mood and affect are normal. Speech and behavior are normal.  ____________________________________________   LABS (all labs ordered are listed, but only abnormal results are displayed)  Labs Reviewed - No data to display  PROCEDURES  Procedure(s) performed (including Critical Care):  Procedures  ___________________________________________   INITIAL IMPRESSION / ASSESSMENT AND PLAN /  ED COURSE  As part of my medical decision making, I reviewed the following data within the electronic MEDICAL RECORD NUMBER Notes from prior ED visits and [place] Controlled Substance Database     38 year old female presents to the ED with complaint of dental pain for approximately 2 weeks.  Patient has a molar that has been filled in the past.  She at sometime in the past has broken the medial aspect of this tooth off.  No obvious abscess was seen.  Patient was placed on antibiotics and encouraged to make an appointment with a dentist to be seen.  ____________________________________________   FINAL CLINICAL IMPRESSION(S) / ED DIAGNOSES  Final diagnoses:  Pain, dental     ED Discharge Orders         Ordered    clindamycin (CLEOCIN) 150 MG capsule     07/24/18 0737    ibuprofen (ADVIL) 600 MG tablet  Every 8 hours PRN     07/24/18 0737           Note:  This document was prepared using Dragon voice recognition software and may include unintentional dictation errors.    Johnn Hai, PA-C 07/24/18 5361    Harvest Dark, MD 07/24/18 1441

## 2018-07-24 NOTE — ED Triage Notes (Signed)
Patient c/o top/left dental pain. Patient reports broken upper left molar. Patient reports pain X 2 weeks with increasing severity today.

## 2018-07-24 NOTE — ED Notes (Signed)
C/o top left dental pain "for a while", does not have a f/u appt with dentist.

## 2018-07-28 ENCOUNTER — Other Ambulatory Visit: Payer: Self-pay

## 2018-07-28 ENCOUNTER — Emergency Department
Admission: EM | Admit: 2018-07-28 | Discharge: 2018-07-28 | Disposition: A | Discharge status: Home / Self Care | Payer: BC Managed Care – PPO | Attending: Emergency Medicine | Admitting: Emergency Medicine

## 2018-07-28 ENCOUNTER — Emergency Department: Payer: BC Managed Care – PPO

## 2018-07-28 ENCOUNTER — Encounter: Payer: Self-pay | Admitting: Emergency Medicine

## 2018-07-28 DIAGNOSIS — R1030 Lower abdominal pain, unspecified: Secondary | ICD-10-CM | POA: Diagnosis present

## 2018-07-28 DIAGNOSIS — N939 Abnormal uterine and vaginal bleeding, unspecified: Secondary | ICD-10-CM | POA: Diagnosis not present

## 2018-07-28 DIAGNOSIS — Z87891 Personal history of nicotine dependence: Secondary | ICD-10-CM | POA: Diagnosis not present

## 2018-07-28 DIAGNOSIS — R9389 Abnormal findings on diagnostic imaging of other specified body structures: Secondary | ICD-10-CM | POA: Diagnosis not present

## 2018-07-28 DIAGNOSIS — Z79899 Other long term (current) drug therapy: Secondary | ICD-10-CM | POA: Insufficient documentation

## 2018-07-28 DIAGNOSIS — R102 Pelvic and perineal pain: Secondary | ICD-10-CM | POA: Diagnosis not present

## 2018-07-28 LAB — URINALYSIS, COMPLETE (UACMP) WITH MICROSCOPIC
Bacteria, UA: NONE SEEN
Bilirubin Urine: NEGATIVE
Glucose, UA: NEGATIVE mg/dL
Ketones, ur: NEGATIVE mg/dL
Leukocytes,Ua: NEGATIVE
Nitrite: NEGATIVE
Protein, ur: 100 mg/dL — AB
RBC / HPF: >50 RBC/hpf — ABNORMAL HIGH (ref 0–5)
Specific Gravity, Urine: 1.028 (ref 1.005–1.030)
pH: 5 (ref 5.0–8.0)

## 2018-07-28 LAB — CBC
HCT: 39.6 % (ref 36.0–46.0)
Hemoglobin: 13 g/dL (ref 12.0–15.0)
MCH: 26.4 pg (ref 26.0–34.0)
MCHC: 32.8 g/dL (ref 30.0–36.0)
MCV: 80.3 fL (ref 80.0–100.0)
Platelets: 329 10*3/uL (ref 150–400)
RBC: 4.93 MIL/uL (ref 3.87–5.11)
RDW: 14.3 % (ref 11.5–15.5)
WBC: 8.3 10*3/uL (ref 4.0–10.5)
nRBC: 0 % (ref 0.0–0.2)

## 2018-07-28 LAB — COMPREHENSIVE METABOLIC PANEL
ALT: 21 U/L (ref 0–44)
AST: 19 U/L (ref 15–41)
Albumin: 4 g/dL (ref 3.5–5.0)
Alkaline Phosphatase: 69 U/L (ref 38–126)
Anion gap: 10 (ref 5–15)
BUN: 11 mg/dL (ref 6–20)
CO2: 22 mmol/L (ref 22–32)
Calcium: 8.9 mg/dL (ref 8.9–10.3)
Chloride: 103 mmol/L (ref 98–111)
Creatinine, Ser: 0.63 mg/dL (ref 0.44–1.00)
GFR calc Af Amer: >60 mL/min (ref >60)
GFR calc non Af Amer: >60 mL/min (ref >60)
Glucose, Bld: 171 mg/dL — ABNORMAL HIGH (ref 70–99)
Potassium: 3.8 mmol/L (ref 3.5–5.1)
Sodium: 135 mmol/L (ref 135–145)
Total Bilirubin: 0.5 mg/dL (ref 0.3–1.2)
Total Protein: 7.9 g/dL (ref 6.5–8.1)

## 2018-07-28 LAB — POCT PREGNANCY, URINE: Preg Test, Ur: NEGATIVE

## 2018-07-28 LAB — LIPASE, BLOOD: Lipase: 25 U/L (ref 11–51)

## 2018-07-28 MED ORDER — HYDROCODONE-ACETAMINOPHEN 5-325 MG PO TABS: 1.0000 | ORAL_TABLET | ORAL | 0 refills | Status: AC | PRN

## 2018-07-28 MED ORDER — MEDROXYPROGESTERONE ACETATE 10 MG PO TABS: 10.0000 mg | ORAL_TABLET | Freq: Every day | ORAL | 0 refills | Status: AC

## 2018-07-28 MED ORDER — SODIUM CHLORIDE 0.9 % IV BOLUS
1000.0000 mL | Freq: Once | INTRAVENOUS | Status: AC
  Administered 2018-07-28: 1000 mL via INTRAVENOUS

## 2018-07-28 MED ORDER — IOHEXOL 300 MG/ML  SOLN
125.0000 mL | Freq: Once | INTRAMUSCULAR | Status: AC | PRN
  Administered 2018-07-28: 19:00:00 125 mL via INTRAVENOUS

## 2018-07-28 MED ORDER — MEDROXYPROGESTERONE ACETATE 10 MG PO TABS
10.0000 mg | ORAL_TABLET | Freq: Every day | ORAL | Status: DC
  Administered 2018-07-28: 10 mg via ORAL
  Filled 2018-07-28: qty 1

## 2018-07-28 MED ORDER — OXYCODONE-ACETAMINOPHEN 5-325 MG PO TABS
1.0000 | ORAL_TABLET | Freq: Once | ORAL | Status: AC
  Administered 2018-07-28: 1 via ORAL
  Filled 2018-07-28: qty 1

## 2018-07-28 NOTE — ED Notes (Signed)
PT up to restroom with no issues

## 2018-07-28 NOTE — ED Notes (Signed)
Patient transported to CT 

## 2018-07-28 NOTE — ED Notes (Signed)
Provider made aware of pt's saturated pad. Pt given new pad, underwear, and paper scrub pants

## 2018-07-28 NOTE — ED Provider Notes (Signed)
[facility] Emergency Department Provider Note  ____________________________________________  Time seen: Approximately 4:06 PM  I have reviewed the triage vital signs and the nursing notes.   HISTORY  Chief Complaint Abdominal Cramping, Abdominal Pain, and Nausea    HPI Carla Daniels is a 38 y.o. female who presents the emergency department complaining of stabbing pain to the suprapubic region.  Patient reports that she has a history of irregular menstrual cycles given her PCOS.  Patient reports that she typically has some lower abdominal cramping during her menstrual cycle, she takes Midol and this improves.  Patient reports that the pain has been severe enough that she has not been able to sleep.  She reports that the bleeding is also abnormal as it is much heavier than normal.  Patient denies any dysuria, polyuria, hematuria.  No flank pain.  She reports that pain is in the pelvic region and denies any abdominal pain.  No nausea vomiting, diarrhea or constipation.  Patient has been taking Tylenol, Midol, Motrin with no relief.         Past Medical History:  Diagnosis Date  . Iron deficiency     There are no active problems to display for this patient.   Past Surgical History:  Procedure Laterality Date  . ABDOMINAL SURGERY    . TONSILLECTOMY      Prior to Admission medications   Medication Sig Start Date End Date Taking? Authorizing Provider  Ascorbic Acid (VITAMIN C ADULT GUMMIES PO) Take 1 Piece by mouth daily.    [provider]  clindamycin (CLEOCIN) 150 MG capsule 2 capsules 3 times daily until finished 07/24/18   Letitia Neri L, PA-C  cyclobenzaprine (FLEXERIL) 5 MG tablet Take 1 tablet (5 mg total) by mouth 3 (three) times daily as needed for muscle spasms. 10/29/17   Drenda Freeze, MD  HYDROcodone-acetaminophen (NORCO/VICODIN) 5-325 MG tablet Take 1 tablet by mouth every 4 (four) hours as needed for moderate pain. 07/28/18    Chaelyn Bunyan, Charline Bills, PA-C  ibuprofen (ADVIL) 600 MG tablet Take 1 tablet (600 mg total) by mouth every 8 (eight) hours as needed. 07/24/18   Johnn Hai, PA-C  medroxyPROGESTERone (PROVERA) 10 MG tablet Take 1 tablet (10 mg total) by mouth daily for 10 days. 07/28/18 08/07/18  Tiersa Dayley, Charline Bills, PA-C  Melatonin 5 MG TABS Take 5 mg by mouth at bedtime as needed (sleep).     [provider]    Allergies Soy allergy, Amoxicillin, Milk-related compounds, and Tramadol  No family history on file.  Social History Social History   Tobacco Use  . Smoking status: Former Research scientist (life sciences)  . Smokeless tobacco: Never Used  Substance Use Topics  . Alcohol use: Yes    Comment: occas  . Drug use: No     Review of Systems  Constitutional: No fever/chills Eyes: No visual changes. No discharge ENT: No upper respiratory complaints. Cardiovascular: no chest pain. Respiratory: no cough. No SOB. Gastrointestinal: No abdominal pain.  No nausea, no vomiting.  No diarrhea.  No constipation. Genitourinary: Negative for dysuria. No hematuria.  Positive for abnormal vaginal bleeding. Musculoskeletal: Negative for musculoskeletal pain. Skin: Negative for rash, abrasions, lacerations, ecchymosis. Neurological: Negative for headaches, focal weakness or numbness. 10-point ROS otherwise negative.  ____________________________________________   PHYSICAL EXAM:  VITAL SIGNS: ED Triage Vitals  Enc Vitals Group     BP 07/28/18 1422 (!) 187/97     Pulse Rate 07/28/18 1422 72     Resp 07/28/18  1422 20     Temp 07/28/18 1422 98.9 F (37.2 C)     Temp Source 07/28/18 1422 Oral     SpO2 07/28/18 1422 98 %     Weight 07/28/18 1357 (!) 358 lb (162.4 kg)     Height 07/28/18 1357 5\' 6"  (1.676 m)     Head Circumference --      Peak Flow --      Pain Score --      Pain Loc --      Pain Edu? --      Excl. in GC? --      Constitutional: Alert and oriented. Well appearing and in no acute  distress. Eyes: Conjunctivae are normal. PERRL. EOMI. Head: Atraumatic. Neck: No stridor.    Cardiovascular: Normal rate, regular rhythm. Normal S1 and S2.  Good peripheral circulation. Respiratory: Normal respiratory effort without tachypnea or retractions. Lungs CTAB. Good air entry to the bases with no decreased or absent breath sounds. Gastrointestinal: Bowel sounds 4 quadrants.  Soft to palpation all quadrants.  Patient is tender to palpation in the suprapubic region, slightly worse on the left than right.  No guarding or rigidity. No palpable masses. No distention. No CVA tenderness. Musculoskeletal: Full range of motion to all extremities. No gross deformities appreciated. Neurologic:  Normal speech and language. No gross focal neurologic deficits are appreciated.  Skin:  Skin is warm, dry and intact. No rash noted. Psychiatric: Mood and affect are normal. Speech and behavior are normal. Patient exhibits appropriate insight and judgement.   ____________________________________________   LABS (all labs ordered are listed, but only abnormal results are displayed)  Labs Reviewed  COMPREHENSIVE METABOLIC PANEL - Abnormal; Notable for the following components:      Result Value   Glucose, Bld 171 (*)    All other components within normal limits  URINALYSIS, COMPLETE (UACMP) WITH MICROSCOPIC - Abnormal; Notable for the following components:   Color, Urine YELLOW (*)    APPearance CLOUDY (*)    Hgb urine dipstick MODERATE (*)    Protein, ur 100 (*)    RBC / HPF >50 (*)    All other components within normal limits  LIPASE, BLOOD  CBC  POC URINE PREG, ED  POCT PREGNANCY, URINE   ____________________________________________  EKG   ____________________________________________  RADIOLOGY I personally viewed and evaluated these images as part of my medical decision making, as well as reviewing the written report by the radiologist.  Ct Abdomen Pelvis W Contrast  Result Date:  07/28/2018 CLINICAL DATA:  Initial evaluation for acute generalized lower abdominal pain/pelvic pain. EXAM: CT ABDOMEN AND PELVIS WITH CONTRAST TECHNIQUE: Multidetector CT imaging of the abdomen and pelvis was performed using the standard protocol following bolus administration of intravenous contrast. CONTRAST:  125mL OMNIPAQUE IOHEXOL 300 MG/ML  SOLN COMPARISON:  Prior CT from 09/20/2009. FINDINGS: Lower chest: Visualized lung bases are clear. Hepatobiliary: Liver demonstrates a normal contrast enhanced appearance. Multiple calcified stones present within the gallbladder lumen. No evidence for acute cholecystitis. No biliary dilatation. Pancreas: Pancreas within normal limits. Spleen: Spleen within normal limits. Adrenals/Urinary Tract: Adrenal glands are normal. Kidneys equal in size with symmetric enhancement. Subcentimeter hypodensity within the interpolar left kidney too small the characterize. No nephrolithiasis, hydronephrosis, or focal enhancing renal mass. No hydroureter. Partially distended bladder within normal limits. Stomach/Bowel: Stomach within normal limits. No evidence for bowel obstruction. Normal appendix. No abnormal wall thickening, mucosal enhancement, or inflammatory fat stranding seen about the bowels. Vascular/Lymphatic: Normal intravascular enhancement  seen throughout the intra-abdominal aorta. Mild atherosclerosis. No aneurysm. Mesenteric vessels patent proximally. Retroaortic left renal vein noted. 15 mm porta hepatis node at the upper limits of normal. Scattered mildly prominent subcentimeter mesenteric nodes noted within the mid abdomen. No pathologically enlarged intra-abdominal or pelvic lymph nodes. Reproductive: Uterus and ovaries within normal limits for age. Previously noted small intramural fibroid not well seen. Other: No free air or fluid. Mild hazy/misty stranding noted within the mid mesentery. Musculoskeletal: No acute osseous finding. No discrete lytic or blastic osseous  lesions. IMPRESSION: 1. Mild hazy/misty stranding within the mid mesentery, nonspecific, but can sometimes be seen in the setting of an occult underlying enteritis. 2. No other CT evidence for acute intra-abdominal or pelvic process. 3. Cholelithiasis. Electronically Signed   By: Rise MuBenjamin  McClintock M.D.   On: 07/28/2018 20:04   [place] Pelvic Complete W Transvaginal And Torsion R/o  Result Date: 07/28/2018 CLINICAL DATA:  Initial evaluation for acute pelvic pain. Abnormal vaginal bleeding. EXAM: TRANSABDOMINAL AND TRANSVAGINAL ULTRASOUND OF PELVIS DOPPLER ULTRASOUND OF OVARIES TECHNIQUE: Both transabdominal and transvaginal ultrasound examinations of the pelvis were performed. Transabdominal technique was performed for global imaging of the pelvis including uterus, ovaries, adnexal regions, and pelvic cul-de-sac. It was necessary to proceed with endovaginal exam following the transabdominal exam to visualize the uterus, endometrium, and ovaries. Color and duplex Doppler ultrasound was utilized to evaluate blood flow to the ovaries. COMPARISON:  Prior CT from 09/20/2009. FINDINGS: Uterus Measurements: 11.4 x 5.6 x 6.6 cm = volume: 220.2 mL. 1.3 x 1.3 x 2.0 cm intramural fibroid present at the right posterior uterine body. Few prominent nabothian cysts noted about the cervix, largest of which measures 1.5 cm. Endometrium Thickness: 17.2 mm.  No focal abnormality visualized. Right ovary Not visualized.  No adnexal mass. Left ovary Not visualized.  No adnexal mass. Pulsed Doppler evaluation of the ovaries was unable to be performed due to nonvisualization. Other findings Trace free fluid within the pelvis, presumably physiologic. IMPRESSION: 1. Endometrial stripe measures 17.2 mm in thickness. If bleeding remains unresponsive to hormonal or medical therapy, focal lesion work-up with sonohysterogram should be considered. Endometrial biopsy should also be considered in pre-menopausal patients at high risk for endometrial  carcinoma. (Ref: Radiological Reasoning: Algorithmic Workup of Abnormal Vaginal Bleeding with Endovaginal Sonography and Sonohysterography. AJR 2008; 540:J81-19; 191:S68-73) . 2. 2 cm intramural fibroid at the posterior right uterine body. 3. Nonvisualization of the ovaries.  No adnexal mass. Electronically Signed   By: Rise MuBenjamin  McClintock M.D.   On: 07/28/2018 19:40    ____________________________________________    PROCEDURES  Procedure(s) performed:    Procedures    Medications  oxyCODONE-acetaminophen (PERCOCET/ROXICET) 5-325 MG per tablet 1 tablet (has no administration in time range)  medroxyPROGESTERone (PROVERA) tablet 10 mg (has no administration in time range)  sodium chloride 0.9 % bolus 1,000 mL (1,000 mLs Intravenous New Bag/Given 07/28/18 1938)  iohexol (OMNIPAQUE) 300 MG/ML solution 125 mL (125 mLs Intravenous Contrast Given 07/28/18 1924)     ____________________________________________   INITIAL IMPRESSION / ASSESSMENT AND PLAN / ED COURSE  Pertinent labs & imaging results that were available during my care of the patient were reviewed by me and considered in my medical decision making (see chart for details).  Review of the [place] CSRS was performed in accordance of the [facility] prior to dispensing any controlled drugs.  Clinical Course as of Jul 28 2135  Tue Jul 28, 2018  1606 Patient presented to the emergency department complaining of sharp pelvic pain  and vaginal bleeding.  Patient has irregular menses.  She typically has some cramping associated with her menses but states that she has never had pain like this.  She does have a history of PCOS.  Labs at this time are reassuring.  Ultrasound has been ordered to further evaluate area.  Differential includes irregular.  With menstrual cramps, PCOS, ovarian cyst, ovarian torsion, endometriosis.   [JC]  1922 Ultrasound was unable to visualize ovaries.  Is there is still concern for cyst, torsion or other pelvic abnormality, CT of the  abdomen pelvis will be performed at this time.   [JC]    Clinical Course User Index [JC] Lia Vigilante, Delorise RoyalsJonathan D, PA-C          Patient's diagnosis is consistent with abnormal vaginal bleeding, thickened endometrial stripe.  Patient presented to the emergency department with pelvic pain, increased vaginal bleeding from normal.  Patient has a very irregular menstrual cycle.  Patient was concerned as she is having significant pain in the pelvic region.  Labs are reassuring.  Initial ultrasound did not reveal the ovaries.  As such CT was ordered.  Patient has some findings of very mild enteritis she does not have any nausea, vomiting, diarrhea or constipation.  No fevers or chills.  This time, do not feel that CT scan findings are resulting in patient's complaints.  Patient does have thickened endometrial stripe.  Patient will be given pain medication, Provera for symptom relief.  If symptoms do not improve follow-up with OB/GYN..  Patient is given ED precautions to return to the ED for any worsening or new symptoms.     ____________________________________________  FINAL CLINICAL IMPRESSION(S) / ED DIAGNOSES  Final diagnoses:  Pelvic pain  Abnormal vaginal bleeding  Increased endometrial stripe      NEW MEDICATIONS STARTED DURING THIS VISIT:  ED Discharge Orders         Ordered    HYDROcodone-acetaminophen (NORCO/VICODIN) 5-325 MG tablet  Every 4 hours PRN     07/28/18 2135    medroxyPROGESTERone (PROVERA) 10 MG tablet  Daily     07/28/18 2135              This chart was dictated using voice recognition software/Dragon. Despite best efforts to proofread, errors can occur which can change the meaning. Any change was purely unintentional.    Lanette HampshireCuthriell, Laurabeth Yip D, PA-C 07/28/18 2137    Sharman CheekStafford, Phillip, MD 07/29/18 2326

## 2018-07-28 NOTE — ED Triage Notes (Signed)
Pt reports yesterday afternoon stared with pain to her lower abdomen. Pt reports pain is constant and stabbing nature.

## 2018-08-17 ENCOUNTER — Other Ambulatory Visit: Payer: Self-pay

## 2018-08-17 ENCOUNTER — Encounter
Admission: RE | Admit: 2018-08-17 | Discharge: 2018-08-17 | Disposition: A | Discharge status: Home / Self Care | Payer: BC Managed Care – PPO | Source: Ambulatory Visit | Attending: Obstetrics and Gynecology | Admitting: Obstetrics and Gynecology

## 2018-08-17 HISTORY — DX: Panic disorder (episodic paroxysmal anxiety): F41.0

## 2018-08-17 HISTORY — DX: Personal history of other diseases of the female genital tract: Z87.42

## 2018-08-17 HISTORY — DX: Anemia, unspecified: D64.9
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
## 2018-08-17 NOTE — Patient Instructions (Signed)
Your procedure is scheduled on: 08/21/18 Report to Day Surgery.[facility]OND FLOOR To find out your arrival time please call [phone] between 1PM - 3PM on 08/20/18.  Remember: Instructions that are not followed completely may result in serious medical risk,  up to and including death, or upon the discretion of your surgeon and anesthesiologist your  surgery may need to be rescheduled.     _X__ 1. Do not eat food after midnight the night before your procedure.                 No gum chewing or hard candies. You may drink clear liquids up to 2 hours                 before you are scheduled to arrive for your surgery- DO not drink clear                 liquids within 2 hours of the start of your surgery.                 Clear Liquids include:  water, apple juice without pulp, clear carbohydrate                 drink such as Clearfast of Gatorade, Black Coffee or Tea (Do not add                 anything to coffee or tea). FINISH CARB DRINK 3 FULL HOURS BEFORE ARRIVAL TIME MORNING OF SURGERY  __X__2.  On the morning of surgery brush your teeth with toothpaste and water, you                may rinse your mouth with mouthwash if you wish.  Do not swallow any toothpaste of mouthwash.     _X__ 3.  No Alcohol for 24 hours before or after surgery.   _X__ 4.  Do Not Smoke or use e-cigarettes For 24 Hours Prior to Your Surgery.                 Do not use any chewable tobacco products for at least 6 hours prior to                 surgery.  ____  5.  Bring all medications with you on the day of surgery if instructed.   __X__  6.  Notify your doctor if there is any change in your medical condition      (cold, fever, infections).     Do not wear jewelry, make-up, hairpins, clips or nail polish. Do not wear lotions, powders, or perfumes. You may wear deodorant. Do not shave 48 hours prior to surgery. Men may shave face and neck. Do not bring valuables to the hospital.     [facility] any belongings or valuables.  Contacts, dentures or bridgework may not be worn into surgery. Leave your suitcase in the car. After surgery it may be brought to your room. For patients admitted to the hospital, discharge time is determined by your treatment team.   Patients discharged the day of surgery will not be allowed to drive home.   Please read over the following fact sheets that you were given:  SPIROMETRY  PRACTICE AND BRING MORNING OF SURGERY         _X___ Take these medicines the morning of surgery with A SIP OF WATER:    1. BIRTH CONTROL PILL  2.   3.  4.  5.  6.  ____ Fleet Enema (as directed)   ____ Use CHG Soap as directed  ____ Use inhalers on the day of surgery  ____ Stop metformin 2 days prior to surgery    ____ Take 1/2 of usual insulin dose the night before surgery. No insulin the morning          of surgery.   ____ Stop Coumadin/Plavix/aspirin on   _X_ Stop Anti-inflammatories on    08/17/18   ____ Stop supplements until after surgery.    ____ Bring C-Pap to the hospital.

## 2018-08-18 ENCOUNTER — Other Ambulatory Visit
Admission: RE | Admit: 2018-08-18 | Discharge: 2018-08-18 | Disposition: A | Discharge status: Home / Self Care | Payer: BC Managed Care – PPO | Source: Ambulatory Visit | Attending: Obstetrics and Gynecology | Admitting: Obstetrics and Gynecology

## 2018-08-18 DIAGNOSIS — Z01812 Encounter for preprocedural laboratory examination: Secondary | ICD-10-CM | POA: Diagnosis not present

## 2018-08-18 DIAGNOSIS — N8500 Endometrial hyperplasia, unspecified: Secondary | ICD-10-CM | POA: Insufficient documentation

## 2018-08-18 DIAGNOSIS — N8502 Endometrial intraepithelial neoplasia [EIN]: Secondary | ICD-10-CM | POA: Insufficient documentation

## 2018-08-18 DIAGNOSIS — Z1159 Encounter for screening for other viral diseases: Secondary | ICD-10-CM | POA: Diagnosis not present

## 2018-08-18 LAB — BASIC METABOLIC PANEL
Anion gap: 10 (ref 5–15)
BUN: 13 mg/dL (ref 6–20)
CO2: 24 mmol/L (ref 22–32)
Calcium: 8.8 mg/dL — ABNORMAL LOW (ref 8.9–10.3)
Chloride: 106 mmol/L (ref 98–111)
Creatinine, Ser: 0.55 mg/dL (ref 0.44–1.00)
GFR calc Af Amer: >60 mL/min (ref >60)
GFR calc non Af Amer: >60 mL/min (ref >60)
Glucose, Bld: 210 mg/dL — ABNORMAL HIGH (ref 70–99)
Potassium: 3.8 mmol/L (ref 3.5–5.1)
Sodium: 140 mmol/L (ref 135–145)

## 2018-08-18 LAB — CBC
HCT: 32.3 % — ABNORMAL LOW (ref 36.0–46.0)
Hemoglobin: 9.8 g/dL — ABNORMAL LOW (ref 12.0–15.0)
MCH: 25 pg — ABNORMAL LOW (ref 26.0–34.0)
MCHC: 30.3 g/dL (ref 30.0–36.0)
MCV: 82.4 fL (ref 80.0–100.0)
Platelets: 352 10*3/uL (ref 150–400)
RBC: 3.92 MIL/uL (ref 3.87–5.11)
RDW: 13.8 % (ref 11.5–15.5)
WBC: 8.9 10*3/uL (ref 4.0–10.5)
nRBC: 0 % (ref 0.0–0.2)

## 2018-08-18 LAB — TYPE AND SCREEN
ABO/RH(D): AB POS
Antibody Screen: NEGATIVE

## 2018-08-18 LAB — SARS CORONAVIRUS 2 (TAT 6-24 HRS): SARS Coronavirus 2: NEGATIVE

## 2018-08-21 ENCOUNTER — Ambulatory Visit: Payer: BC Managed Care – PPO | Admitting: Anesthesiology

## 2018-08-21 ENCOUNTER — Encounter: Payer: Self-pay | Admitting: *Deleted

## 2018-08-21 ENCOUNTER — Ambulatory Visit
Admission: RE | Admit: 2018-08-21 | Discharge: 2018-08-21 | Disposition: A | Discharge status: Home / Self Care | Payer: BC Managed Care – PPO | Attending: Obstetrics and Gynecology | Admitting: Obstetrics and Gynecology

## 2018-08-21 ENCOUNTER — Other Ambulatory Visit: Payer: Self-pay

## 2018-08-21 ENCOUNTER — Encounter
Admission: RE | Disposition: A | Discharge status: Home / Self Care | Payer: Self-pay | Source: Home / Self Care | Attending: Obstetrics and Gynecology

## 2018-08-21 DIAGNOSIS — N939 Abnormal uterine and vaginal bleeding, unspecified: Secondary | ICD-10-CM | POA: Insufficient documentation

## 2018-08-21 DIAGNOSIS — Z87891 Personal history of nicotine dependence: Secondary | ICD-10-CM | POA: Diagnosis not present

## 2018-08-21 DIAGNOSIS — D649 Anemia, unspecified: Secondary | ICD-10-CM | POA: Insufficient documentation

## 2018-08-21 DIAGNOSIS — Z793 Long term (current) use of hormonal contraceptives: Secondary | ICD-10-CM | POA: Insufficient documentation

## 2018-08-21 DIAGNOSIS — E282 Polycystic ovarian syndrome: Secondary | ICD-10-CM | POA: Diagnosis not present

## 2018-08-21 DIAGNOSIS — Z6841 Body Mass Index (BMI) 40.0 and over, adult: Secondary | ICD-10-CM | POA: Insufficient documentation

## 2018-08-21 DIAGNOSIS — Z79899 Other long term (current) drug therapy: Secondary | ICD-10-CM | POA: Diagnosis not present

## 2018-08-21 DIAGNOSIS — N8502 Endometrial intraepithelial neoplasia [EIN]: Secondary | ICD-10-CM | POA: Diagnosis not present

## 2018-08-21 HISTORY — PX: HYSTEROSCOPY WITH D & C: SHX1775

## 2018-08-21 LAB — POCT PREGNANCY, URINE: Preg Test, Ur: NEGATIVE

## 2018-08-21 LAB — ABO/RH: ABO/RH(D): AB POS

## 2018-08-21 SURGERY — DILATATION AND CURETTAGE /HYSTEROSCOPY
Anesthesia: General | Site: Vagina

## 2018-08-21 MED ORDER — SILVER NITRATE-POT NITRATE 75-25 % EX MISC
CUTANEOUS | Status: AC
  Filled 2018-08-21: qty 1

## 2018-08-21 MED ORDER — FENTANYL CITRATE (PF) 100 MCG/2ML IJ SOLN
INTRAMUSCULAR | Status: DC | PRN
  Administered 2018-08-21 (×2): 50 ug via INTRAVENOUS

## 2018-08-21 MED ORDER — SUCCINYLCHOLINE CHLORIDE 20 MG/ML IJ SOLN
INTRAMUSCULAR | Status: DC | PRN
  Administered 2018-08-21: 140 mg via INTRAVENOUS

## 2018-08-21 MED ORDER — SILVER NITRATE-POT NITRATE 75-25 % EX MISC
CUTANEOUS | Status: DC | PRN
  Administered 2018-08-21: 2

## 2018-08-21 MED ORDER — FAMOTIDINE 20 MG PO TABS
20.0000 mg | ORAL_TABLET | Freq: Once | ORAL | Status: AC
  Administered 2018-08-21: 13:00:00 20 mg via ORAL

## 2018-08-21 MED ORDER — DEXMEDETOMIDINE HCL 200 MCG/2ML IV SOLN
INTRAVENOUS | Status: DC | PRN
  Administered 2018-08-21: 8 ug via INTRAVENOUS

## 2018-08-21 MED ORDER — DEXTROSE 5 % IV SOLN
3.0000 g | Freq: Once | INTRAVENOUS | Status: AC
  Administered 2018-08-21: 3 g via INTRAVENOUS
  Filled 2018-08-21: qty 3

## 2018-08-21 MED ORDER — MIDAZOLAM HCL 2 MG/2ML IJ SOLN
INTRAMUSCULAR | Status: AC
  Filled 2018-08-21: qty 2

## 2018-08-21 MED ORDER — FENTANYL CITRATE (PF) 100 MCG/2ML IJ SOLN: 25.0000 ug | INTRAMUSCULAR | Status: DC | PRN

## 2018-08-21 MED ORDER — ONDANSETRON HCL 4 MG/2ML IJ SOLN
INTRAMUSCULAR | Status: DC | PRN
  Administered 2018-08-21: 4 mg via INTRAVENOUS

## 2018-08-21 MED ORDER — OXYCODONE HCL 5 MG PO TABS
5.0000 mg | ORAL_TABLET | Freq: Once | ORAL | Status: AC | PRN
  Administered 2018-08-21: 5 mg via ORAL

## 2018-08-21 MED ORDER — ROCURONIUM BROMIDE 100 MG/10ML IV SOLN
INTRAVENOUS | Status: DC | PRN
  Administered 2018-08-21: 5 mg via INTRAVENOUS
  Administered 2018-08-21: 15 mg via INTRAVENOUS

## 2018-08-21 MED ORDER — LACTATED RINGERS IV SOLN
INTRAVENOUS | Status: DC
  Administered 2018-08-21: 16:00:00 via INTRAVENOUS

## 2018-08-21 MED ORDER — ACETAMINOPHEN 500 MG PO TABS
ORAL_TABLET | ORAL | Status: AC
  Administered 2018-08-21: 1000 mg via ORAL
  Filled 2018-08-21: qty 2

## 2018-08-21 MED ORDER — SUGAMMADEX SODIUM 500 MG/5ML IV SOLN
INTRAVENOUS | Status: AC
  Filled 2018-08-21: qty 5

## 2018-08-21 MED ORDER — FAMOTIDINE 20 MG PO TABS
ORAL_TABLET | ORAL | Status: AC
  Administered 2018-08-21: 13:00:00 20 mg via ORAL
  Filled 2018-08-21: qty 1

## 2018-08-21 MED ORDER — DEXAMETHASONE SODIUM PHOSPHATE 10 MG/ML IJ SOLN
INTRAMUSCULAR | Status: DC | PRN
  Administered 2018-08-21: 5 mg via INTRAVENOUS

## 2018-08-21 MED ORDER — FENTANYL CITRATE (PF) 100 MCG/2ML IJ SOLN
INTRAMUSCULAR | Status: AC
  Filled 2018-08-21: qty 2

## 2018-08-21 MED ORDER — CELECOXIB 200 MG PO CAPS
ORAL_CAPSULE | ORAL | Status: AC
  Administered 2018-08-21: 400 mg via ORAL
  Filled 2018-08-21: qty 2

## 2018-08-21 MED ORDER — SODIUM CHLORIDE FLUSH 0.9 % IV SOLN
INTRAVENOUS | Status: AC
  Filled 2018-08-21: qty 10

## 2018-08-21 MED ORDER — OXYCODONE HCL 5 MG/5ML PO SOLN: 5.0000 mg | Freq: Once | ORAL | Status: AC | PRN

## 2018-08-21 MED ORDER — LACTATED RINGERS IV SOLN
INTRAVENOUS | Status: DC
  Administered 2018-08-21: 13:00:00 via INTRAVENOUS

## 2018-08-21 MED ORDER — PROMETHAZINE HCL 25 MG/ML IJ SOLN
6.2500 mg | INTRAMUSCULAR | Status: AC | PRN
  Administered 2018-08-21 (×2): 6.25 mg via INTRAVENOUS

## 2018-08-21 MED ORDER — SUGAMMADEX SODIUM 500 MG/5ML IV SOLN
INTRAVENOUS | Status: DC | PRN
  Administered 2018-08-21: 350 mg via INTRAVENOUS

## 2018-08-21 MED ORDER — LIDOCAINE HCL (CARDIAC) PF 100 MG/5ML IV SOSY
PREFILLED_SYRINGE | INTRAVENOUS | Status: DC | PRN
  Administered 2018-08-21: 60 mg via INTRAVENOUS

## 2018-08-21 MED ORDER — PROPOFOL 10 MG/ML IV BOLUS
INTRAVENOUS | Status: AC
  Filled 2018-08-21: qty 20

## 2018-08-21 MED ORDER — MEPERIDINE HCL 50 MG/ML IJ SOLN: 6.2500 mg | INTRAMUSCULAR | Status: DC | PRN

## 2018-08-21 MED ORDER — PROMETHAZINE HCL 25 MG/ML IJ SOLN
INTRAMUSCULAR | Status: AC
  Administered 2018-08-21: 16:00:00 6.25 mg via INTRAVENOUS
  Filled 2018-08-21: qty 1

## 2018-08-21 MED ORDER — CELECOXIB 200 MG PO CAPS
400.0000 mg | ORAL_CAPSULE | ORAL | Status: AC
Start: 2018-08-22 — End: 2018-08-21
  Administered 2018-08-21: 13:00:00 400 mg via ORAL

## 2018-08-21 MED ORDER — GLYCOPYRROLATE 0.2 MG/ML IJ SOLN
INTRAMUSCULAR | Status: DC | PRN
  Administered 2018-08-21: 0.2 mg via INTRAVENOUS

## 2018-08-21 MED ORDER — MIDAZOLAM HCL 2 MG/2ML IJ SOLN
INTRAMUSCULAR | Status: DC | PRN
  Administered 2018-08-21: 2 mg via INTRAVENOUS

## 2018-08-21 MED ORDER — PROPOFOL 10 MG/ML IV BOLUS
INTRAVENOUS | Status: DC | PRN
  Administered 2018-08-21: 200 mg via INTRAVENOUS

## 2018-08-21 MED ORDER — OXYCODONE HCL 5 MG PO TABS
ORAL_TABLET | ORAL | Status: AC
  Administered 2018-08-21: 17:00:00 5 mg via ORAL
  Filled 2018-08-21: qty 1

## 2018-08-21 MED ORDER — ALBUTEROL SULFATE HFA 108 (90 BASE) MCG/ACT IN AERS
INHALATION_SPRAY | RESPIRATORY_TRACT | Status: DC | PRN
  Administered 2018-08-21: 8 via RESPIRATORY_TRACT

## 2018-08-21 MED ORDER — ACETAMINOPHEN 500 MG PO TABS
1000.0000 mg | ORAL_TABLET | ORAL | Status: AC
  Administered 2018-08-21: 13:00:00 1000 mg via ORAL

## 2018-08-21 SURGICAL SUPPLY — 21 items
BAG INFUSER PRESSURE 100CC (MISCELLANEOUS) ×2 IMPLANT
CANISTER SUCT 3000ML PPV (MISCELLANEOUS) ×3 IMPLANT
CATH ROBINSON RED A/P 16FR (CATHETERS) IMPLANT
COVER WAND RF STERILE (DRAPES) IMPLANT
DEVICE MYOSURE LITE (MISCELLANEOUS) IMPLANT
DEVICE MYOSURE REACH (MISCELLANEOUS) IMPLANT
GLOVE BIO SURGEON STRL SZ8 (GLOVE) ×3 IMPLANT
GOWN STRL REUS W/ TWL LRG LVL3 (GOWN DISPOSABLE) ×1 IMPLANT
GOWN STRL REUS W/ TWL XL LVL3 (GOWN DISPOSABLE) ×1 IMPLANT
GOWN STRL REUS W/TWL LRG LVL3 (GOWN DISPOSABLE) ×2
GOWN STRL REUS W/TWL XL LVL3 (GOWN DISPOSABLE) ×2
KIT PROCEDURE FLUENT (KITS) ×3 IMPLANT
KIT TURNOVER CYSTO (KITS) ×3 IMPLANT
PACK DNC HYST (MISCELLANEOUS) IMPLANT
PAD OB MATERNITY 4.3X12.25 (PERSONAL CARE ITEMS) ×3 IMPLANT
PAD PREP 24X41 OB/GYN DISP (PERSONAL CARE ITEMS) ×3 IMPLANT
SOL .9 NS 3000ML IRR  AL (IV SOLUTION) ×2
SOL .9 NS 3000ML IRR UROMATIC (IV SOLUTION) ×1 IMPLANT
TOWEL OR 17X26 4PK STRL BLUE (TOWEL DISPOSABLE) ×3 IMPLANT
TUBING CONNECTING 10 (TUBING) IMPLANT
TUBING CONNECTING 10' (TUBING)

## 2018-08-21 NOTE — Anesthesia Preprocedure Evaluation (Signed)
Anesthesia Evaluation  Patient identified by MRN, date of birth, ID band Patient awake    Reviewed: Allergy & Precautions, NPO status , Patient's Chart, lab work & pertinent test results  History of Anesthesia Complications Negative for: history of anesthetic complications  Airway Mallampati: III  TM Distance: >3 FB Neck ROM: Full    Dental  (+) Poor Dentition   Pulmonary neg sleep apnea, neg COPD, former smoker,    breath sounds clear to auscultation- rhonchi (-) wheezing      Cardiovascular (-) hypertension(-) CAD, (-) Past MI, (-) Cardiac Stents and (-) CABG  Rhythm:Regular Rate:Normal - Systolic murmurs and - Diastolic murmurs    Neuro/Psych neg Seizures Anxiety negative neurological ROS     GI/Hepatic negative GI ROS, Neg liver ROS,   Endo/Other  negative endocrine ROSneg diabetes  Renal/GU negative Renal ROS     Musculoskeletal negative musculoskeletal ROS (+)   Abdominal (+) + obese,   Peds  Hematology  (+) anemia ,   Anesthesia Other Findings Past Medical History: No date: Anemia No date: History of PCOS No date: Iron deficiency No date: Panic attacks   Reproductive/Obstetrics                             Anesthesia Physical Anesthesia Plan  ASA: II  Anesthesia Plan: General   Post-op Pain Management:    Induction: Intravenous  PONV Risk Score and Plan: 2 and Ondansetron, Dexamethasone and Midazolam  Airway Management Planned: Oral ETT  Additional Equipment:   Intra-op Plan:   Post-operative Plan: Extubation in OR  Informed Consent: I have reviewed the patients History and Physical, chart, labs and discussed the procedure including the risks, benefits and alternatives for the proposed anesthesia with the patient or authorized representative who has indicated his/her understanding and acceptance.     Dental advisory given  Plan Discussed with: CRNA and  Anesthesiologist  Anesthesia Plan Comments:         Anesthesia Quick Evaluation

## 2018-08-21 NOTE — Op Note (Signed)
NAME: VENDA, DICE MEDICAL RECORD [id number] ACCOUNT [id number] DATE OF BIRTH:[birth year] FACILITY: [facility] LOCATION: [facility] PHYSICIAN:Damascus Feldpausch Josefine Class, MD  OPERATIVE REPORT  DATE OF PROCEDURE:  08/21/2018  PREOPERATIVE DIAGNOSIS:  Endometrial intraepithelial neoplasia, with atypia.  POSTOPERATIVE DIAGNOSIS:  Endometrial intraepithelial neoplasia, with atypia.  PROCEDURE: 1.  Fractional dilation and curettage. 2.  Hysteroscopy.  ANESTHESIA:  General endotracheal anesthesia.  SURGEON:  Laverta Baltimore, MD  INDICATIONS:  A 38 year old gravida 0 patient with abnormal uterine bleeding and underwent an endometrial biopsy in the office that showed endometrial intraepithelial neoplasia with atypia.  The patient has morbid obesity and is at risk for uterine  cancer.  DESCRIPTION OF PROCEDURE:  After adequate general endotracheal anesthesia, the patient was placed in the [place] and the perineum and vagina were prepped and draped in normal sterile fashion.  Timeout was performed.  The patient did receive 3  grams of IV Ancef prior to commencement of the case for surgical prophylaxis.  A straight catheterization of the bladder yielded 100 mL of clear urine.  A speculum was used to visualize the cervix, which was grasped with a single tooth tenaculum on the  anterior aspect.  Endocervical curettage was performed followed by uterine sounding to 7 cm.  Cervix was then dilated to #15 Hanks dilator followed by placement of the hysteroscope into the endometrial cavity.  Normal saline was used as distending  medium.  The endometrial cavity appeared to have lush endometrial tissue.  Hysteroscope was removed and the cervix was dilated to a #20 Hanks dilator followed by a thorough endometrial curettage.  Adequate tissue was removed and will be sent to pathology  for identification.  Good hemostasis was noted.  Single tooth tenaculum was taken off and there was a small oozing  from the tenaculum site, which was controlled with silver nitrate.  COMPLICATIONS:  There were no complications.  ESTIMATED BLOOD LOSS:  Minimal.  INTRAOPERATIVE FLUIDS:  700 mL  URINE OUTPUT:  100 mL  DISPOSITION:  The patient was taken to recovery room in good condition.  TN/NUANCE  D:08/21/2018 T:08/21/2018 JOB:[id number]

## 2018-08-21 NOTE — Brief Op Note (Signed)
08/21/2018  3:51 PM  PATIENT:  Carla Daniels  38 y.o. female  PRE-OPERATIVE DIAGNOSIS:  endometrial intraepithelial neoplasia, atypical endometrial hyperplasia  POST-OPERATIVE DIAGNOSIS:  endometrial intraepithelial neoplasia, atypical endometrial hyperplasia  PROCEDURE:  Fractional dilation and curettage , hysteroscopy  SURGEON:  Surgeon(s) and Role:    * Schermerhorn, Gwen Her, MD - Primary  PHYSICIAN ASSISTANT:   ASSISTANTS: none   ANESTHESIA:   general  EBL:  25 mL   BLOOD ADMINISTERED:none  DRAINS: none   LOCAL MEDICATIONS USED:  NONE  SPECIMEN:  Source of Specimen:  ecc, endometrial curettings   DISPOSITION OF SPECIMEN:  PATHOLOGY  COUNTS:  YES  TOURNIQUET:  * No tourniquets in log *  DICTATION: .Other Dictation: Dictation Number verbal  PLAN OF CARE: Discharge to home after PACU  PATIENT DISPOSITION:  PACU - hemodynamically stable.   Delay start of Pharmacological VTE agent (>24hrs) due to surgical blood loss or risk of bleeding: not applicable

## 2018-08-21 NOTE — Progress Notes (Signed)
Scheduled for a Fractional D+C and H/s for EIN  With atypia . Neg HCG and Covid . All questions answered . Proceed

## 2018-08-21 NOTE — Transfer of Care (Signed)
Immediate Anesthesia Transfer of Care Note  Patient: Carla Daniels  Procedure(s) Performed: DILATATION AND CURETTAGE /HYSTEROSCOPY, fractional (N/A Vagina )  Patient Location: PACU  Anesthesia Type:General  Level of Consciousness: sedated  Airway & Oxygen Therapy: Patient Spontanous Breathing and Patient connected to face mask oxygen  Post-op Assessment: Report given to RN and Post -op Vital signs reviewed and stable  Post vital signs: Reviewed and stable  Last Vitals:  Vitals Value Taken Time  BP 145/86 08/21/18 1600  Temp    Pulse 79 08/21/18 1602  Resp 14 08/21/18 1602  SpO2 100 % 08/21/18 1602  Vitals shown include unvalidated device data.  Last Pain:  Vitals:   08/21/18 1217  TempSrc: Temporal  PainSc: 6          Complications: No apparent anesthesia complications

## 2018-08-21 NOTE — Discharge Instructions (Addendum)
Dilation and Curettage or Vacuum Curettage, Care After °These instructions give you information about caring for yourself after your procedure. Your doctor may also give you more specific instructions. Call your doctor if you have any problems or questions after your procedure. °Follow these instructions at home: °Activity °· Do not drive or use heavy machinery while taking prescription pain medicine. °· For 24 hours after your procedure, avoid driving. °· Take short walks often, followed by rest periods. Ask your doctor what activities are safe for you. After one or two days, you may be able to return to your normal activities. °· Do not lift anything that is heavier than 10 lb (4.5 kg) until your doctor approves. °· For at least 2 weeks, or as long as told by your doctor: °? Do not douche. °? Do not use tampons. °? Do not have sex. °General instructions ° °· Take over-the-counter and prescription medicines only as told by your doctor. This is very important if you take blood thinning medicine. °· Do not take baths, swim, or use a hot tub until your doctor approves. Take showers instead of baths. °· Wear compression stockings as told by your doctor. °· It is up to you to get the results of your procedure. Ask your doctor when your results will be ready. °· Keep all follow-up visits as told by your doctor. This is important. °Contact a doctor if: °· You have very bad cramps that get worse or do not get better with medicine. °· You have very bad pain in your belly (abdomen). °· You cannot drink fluids without throwing up (vomiting). °· You get pain in a different part of the area between your belly and thighs (pelvis). °· You have bad-smelling discharge from your vagina. °· You have a rash. °Get help right away if: °· You are bleeding a lot from your vagina. A lot of bleeding means soaking more than one sanitary pad in an hour, for 2 hours in a row. °· You have clumps of blood (blood clots) coming from your  vagina. °· You have a fever or chills. °· Your belly feels very tender or hard. °· You have chest pain. °· You have trouble breathing. °· You cough up blood. °· You feel dizzy. °· You feel light-headed. °· You pass out (faint). °· You have pain in your neck or shoulder area. °Summary °· Take short walks often, followed by rest periods. Ask your doctor what activities are safe for you. After one or two days, you may be able to return to your normal activities. °· Do not lift anything that is heavier than 10 lb (4.5 kg) until your doctor approves. °· Do not take baths, swim, or use a hot tub until your doctor approves. Take showers instead of baths. °· Contact your doctor if you have any symptoms of infection, like bad-smelling discharge from your vagina. °This information is not intended to replace advice given to you by your health care provider. Make sure you discuss any questions you have with your health care provider. °Document Released: 10/31/2007 Document Revised: 01/03/2017 Document Reviewed: 10/09/2015 °Elsevier Patient Education © 2020 [employer] ° ° ° °AMBULATORY SURGERY  °DISCHARGE INSTRUCTIONS ° ° °1) The drugs that you were given will stay in your system until tomorrow so for the next 24 hours you should not: ° °A) Drive an automobile °B) Make any legal decisions °C) Drink any alcoholic beverage ° ° °2) You may resume regular meals tomorrow.  Today it is   better to start with liquids and gradually work up to solid foods. ° °You may eat anything you prefer, but it is better to start with liquids, then soup and crackers, and gradually work up to solid foods. ° ° °3) Please notify your doctor immediately if you have any unusual bleeding, trouble breathing, redness and pain at the surgery site, drainage, fever, or pain not relieved by medication. ° ° ° °4) Additional Instructions: ° ° ° ° ° ° ° °Please contact your physician with any problems or Same Day Surgery at [phone], Monday through Friday 6 am  to 4 pm, or  at [place] Main number at [phone]. ° ° °

## 2018-08-21 NOTE — Anesthesia Post-op Follow-up Note (Signed)
Anesthesia QCDR form completed.        

## 2018-08-21 NOTE — Anesthesia Procedure Notes (Signed)
Procedure Name: Intubation Date/Time: 08/21/2018 3:11 PM Performed by: Dionne Bucy, CRNA Pre-anesthesia Checklist: Patient identified, Patient being monitored, Timeout performed, Emergency Drugs available and Suction available Patient Re-evaluated:Patient Re-evaluated prior to induction Oxygen Delivery Method: Circle system utilized Preoxygenation: Pre-oxygenation with 100% oxygen Induction Type: IV induction Ventilation: Mask ventilation without difficulty Laryngoscope Size: 3 and McGraph Grade View: Grade I Tube type: Oral Tube size: 7.0 mm Number of attempts: 1 Airway Equipment and Method: Stylet Placement Confirmation: ETT inserted through vocal cords under direct vision,  positive ETCO2 and breath sounds checked- equal and bilateral Secured at: 22 cm Tube secured with: Tape Dental Injury: Teeth and Oropharynx as per pre-operative assessment  Difficulty Due To: Difficulty was anticipated and Difficult Airway- due to limited oral opening Future Recommendations: Recommend- induction with short-acting agent, and alternative techniques readily available

## 2018-08-24 ENCOUNTER — Encounter: Payer: Self-pay | Admitting: Obstetrics and Gynecology

## 2018-08-24 NOTE — Anesthesia Postprocedure Evaluation (Signed)
Anesthesia Post Note  Patient: Carla Daniels  Procedure(s) Performed: DILATATION AND CURETTAGE /HYSTEROSCOPY, fractional (N/A Vagina )  Patient location during evaluation: PACU Anesthesia Type: General Level of consciousness: awake and alert and oriented Pain management: pain level controlled Vital Signs Assessment: post-procedure vital signs reviewed and stable Respiratory status: spontaneous breathing, nonlabored ventilation and respiratory function stable Cardiovascular status: blood pressure returned to baseline and stable Postop Assessment: no signs of nausea or vomiting Anesthetic complications: no     Last Vitals:  Vitals:   08/21/18 1711 08/21/18 1740  BP: 140/77 (!) 153/85  Pulse: 71 77  Resp: 16 16  Temp:    SpO2: 100% 99%    Last Pain:  Vitals:   08/21/18 1740  TempSrc:   PainSc: 4                  Hollace Michelli

## 2018-08-25 LAB — SURGICAL PATHOLOGY

## 2019-02-26 ENCOUNTER — Ambulatory Visit: Payer: BC Managed Care – PPO | Attending: Internal Medicine

## 2019-02-26 DIAGNOSIS — Z20822 Contact with and (suspected) exposure to covid-19: Secondary | ICD-10-CM

## 2019-02-27 LAB — NOVEL CORONAVIRUS, NAA: SARS-CoV-2, NAA: DETECTED — AB

## 2019-02-28 ENCOUNTER — Other Ambulatory Visit: Payer: Self-pay | Admitting: Physician Assistant

## 2019-02-28 DIAGNOSIS — U071 COVID-19: Secondary | ICD-10-CM

## 2019-02-28 NOTE — Progress Notes (Signed)
  I connected by phone with Carla Daniels on 02/28/2019 at 10:50 AM to discuss the potential use of an new treatment for mild to moderate COVID-19 viral infection in non-hospitalized patients.  This patient is a 39 y.o. female that meets the FDA criteria for Emergency Use Authorization of bamlanivimab or casirivimab\imdevimab.  Has a (+) direct SARS-CoV-2 viral test result  Has mild or moderate COVID-19   Is ? 37 years of age and weighs ? 40 kg  Is NOT hospitalized due to COVID-19  Is NOT requiring oxygen therapy or requiring an increase in baseline oxygen flow rate due to COVID-19  Is within 10 days of symptom onset  Has at least one of the high risk factor(s) for progression to severe COVID-19 and/or hospitalization as defined in EUA.  Specific high risk criteria : BMI >/= 35   I have spoken and communicated the following to the patient or parent/caregiver:  1. FDA has authorized the emergency use of bamlanivimab and casirivimab\imdevimab for the treatment of mild to moderate COVID-19 in adults and pediatric patients with positive results of direct SARS-CoV-2 viral testing who are 39 years of age and older weighing at least 40 kg, and who are at high risk for progressing to severe COVID-19 and/or hospitalization.  2. The significant known and potential risks and benefits of bamlanivimab and casirivimab\imdevimab, and the extent to which such potential risks and benefits are unknown.  3. Information on available alternative treatments and the risks and benefits of those alternatives, including clinical trials.  4. Patients treated with bamlanivimab and casirivimab\imdevimab should continue to self-isolate and use infection control measures (e.g., wear mask, isolate, social distance, avoid sharing personal items, clean and disinfect "high touch" surfaces, and frequent handwashing) according to CDC guidelines.   5. The patient or parent/caregiver has the option to accept or refuse  bamlanivimab or casirivimab\imdevimab .  After reviewing this information with the patient, The patient agreed to proceed with receiving the bamlanimivab infusion and will be provided a copy of the Fact sheet prior to receiving the infusion.   Signed up for 03/03/19 @ 8:30am. Sx onset 02/24/19.  Cline Crock 02/28/2019 10:50 AM

## 2019-03-03 ENCOUNTER — Ambulatory Visit (HOSPITAL_COMMUNITY)
Admission: RE | Admit: 2019-03-03 | Discharge: 2019-03-03 | Disposition: A | Discharge status: Home / Self Care | Payer: BC Managed Care – PPO | Source: Ambulatory Visit | Attending: Pulmonary Disease | Admitting: Pulmonary Disease

## 2019-03-03 DIAGNOSIS — U071 COVID-19: Secondary | ICD-10-CM | POA: Insufficient documentation

## 2019-03-03 MED ORDER — ALBUTEROL SULFATE HFA 108 (90 BASE) MCG/ACT IN AERS: 2.0000 | INHALATION_SPRAY | Freq: Once | RESPIRATORY_TRACT | Status: DC | PRN

## 2019-03-03 MED ORDER — FAMOTIDINE IN NACL 20-0.9 MG/50ML-% IV SOLN: 20.0000 mg | Freq: Once | INTRAVENOUS | Status: DC | PRN

## 2019-03-03 MED ORDER — ONDANSETRON HCL 4 MG/2ML IJ SOLN
INTRAMUSCULAR | Status: AC
  Filled 2019-03-03: qty 2

## 2019-03-03 MED ORDER — SODIUM CHLORIDE 0.9 % IV SOLN
INTRAVENOUS | Status: AC
  Filled 2019-03-03: qty 20

## 2019-03-03 MED ORDER — ONDANSETRON HCL 4 MG/2ML IJ SOLN
4.0000 mg | Freq: Once | INTRAMUSCULAR | Status: AC
  Administered 2019-03-03: 4 mg via INTRAVENOUS

## 2019-03-03 MED ORDER — METHYLPREDNISOLONE SODIUM SUCC 125 MG IJ SOLR: 125.0000 mg | Freq: Once | INTRAMUSCULAR | Status: DC | PRN

## 2019-03-03 MED ORDER — SODIUM CHLORIDE 0.9 % IV SOLN: INTRAVENOUS | Status: DC | PRN

## 2019-03-03 MED ORDER — SODIUM CHLORIDE 0.9 % IV SOLN
700.0000 mg | Freq: Once | INTRAVENOUS | Status: AC
  Administered 2019-03-03: 700 mg via INTRAVENOUS
  Filled 2019-03-03: qty 20

## 2019-03-03 MED ORDER — DIPHENHYDRAMINE HCL 50 MG/ML IJ SOLN: 50.0000 mg | Freq: Once | INTRAMUSCULAR | Status: DC | PRN

## 2019-03-03 MED ORDER — EPINEPHRINE 0.3 MG/0.3ML IJ SOAJ: 0.3000 mg | Freq: Once | INTRAMUSCULAR | Status: DC | PRN

## 2019-03-03 NOTE — Discharge Instructions (Signed)

## 2019-03-03 NOTE — Progress Notes (Signed)
  Diagnosis: COVID-19  Physician: Shan Levans, MD  Procedure: [facility] Med: bamlanivimab infusion - Provided patient with bamlanimivab fact sheet for patients, parents and caregivers prior to infusion.  Complications: No immediate complications noted.  Discharge: Discharged home   Carla Daniels C 03/03/2019

## 2019-10-27 ENCOUNTER — Other Ambulatory Visit: Payer: Self-pay | Admitting: Internal Medicine

## 2019-10-27 DIAGNOSIS — R55 Syncope and collapse: Secondary | ICD-10-CM

## 2019-11-02 ENCOUNTER — Ambulatory Visit
Admission: RE | Admit: 2019-11-02 | Discharge: 2019-11-02 | Disposition: A | Discharge status: Home / Self Care | Payer: BC Managed Care – PPO | Source: Ambulatory Visit | Attending: Internal Medicine | Admitting: Internal Medicine

## 2019-11-02 ENCOUNTER — Other Ambulatory Visit: Payer: Self-pay | Admitting: Internal Medicine

## 2019-11-02 DIAGNOSIS — R55 Syncope and collapse: Secondary | ICD-10-CM

## 2019-11-02 DIAGNOSIS — E039 Hypothyroidism, unspecified: Secondary | ICD-10-CM

## 2019-11-03 ENCOUNTER — Ambulatory Visit: Payer: BC Managed Care – PPO

## 2019-11-15 ENCOUNTER — Other Ambulatory Visit: Payer: BC Managed Care – PPO

## 2019-12-17 IMAGING — DX DG FOOT COMPLETE 3+V*L*
3 series · 3 of 3 positions shown · non-contrast
Comparison: None.

CLINICAL DATA: Stubbed little toe on [REDACTED] night. Progressive
pain and bruising.

EXAM:
LEFT FOOT - COMPLETE 3+ VIEW

[foot ap]
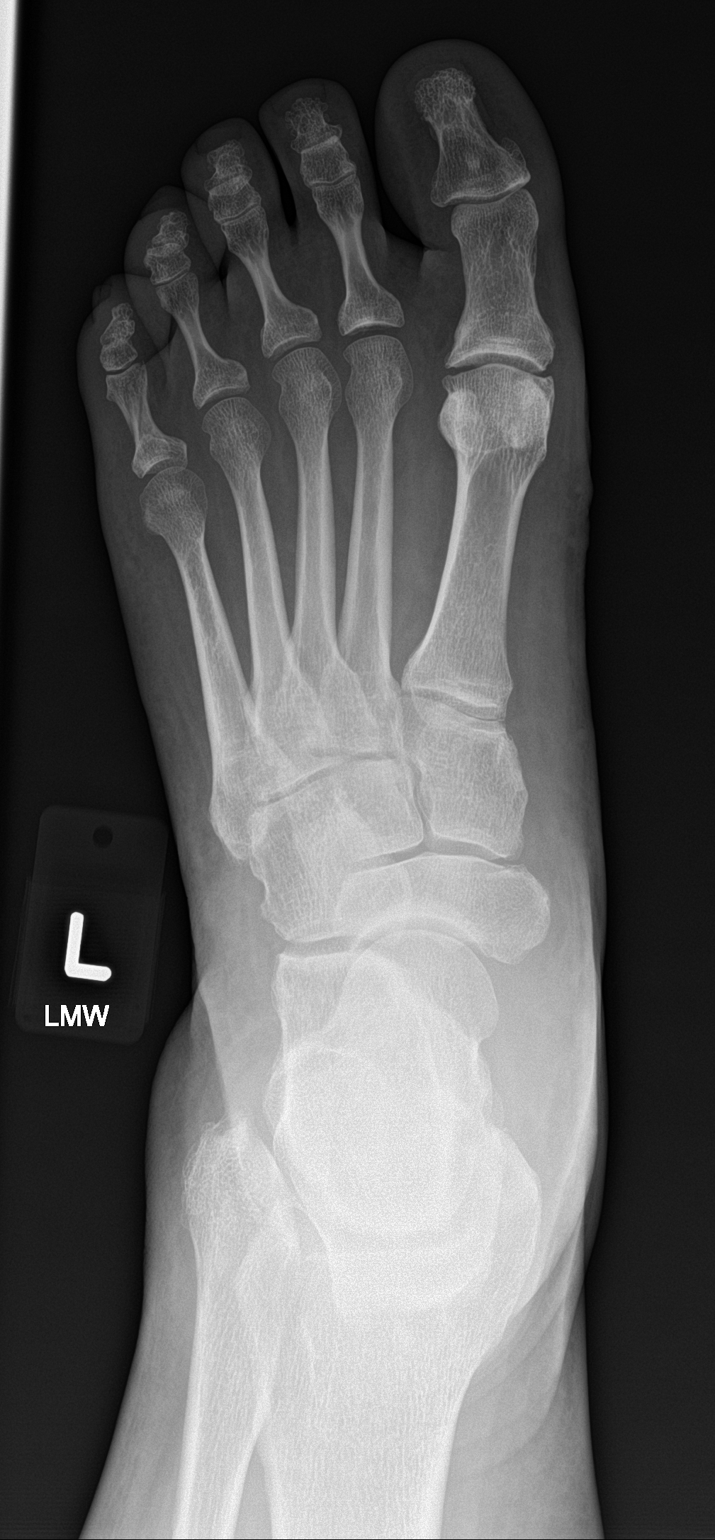

[foot obl]
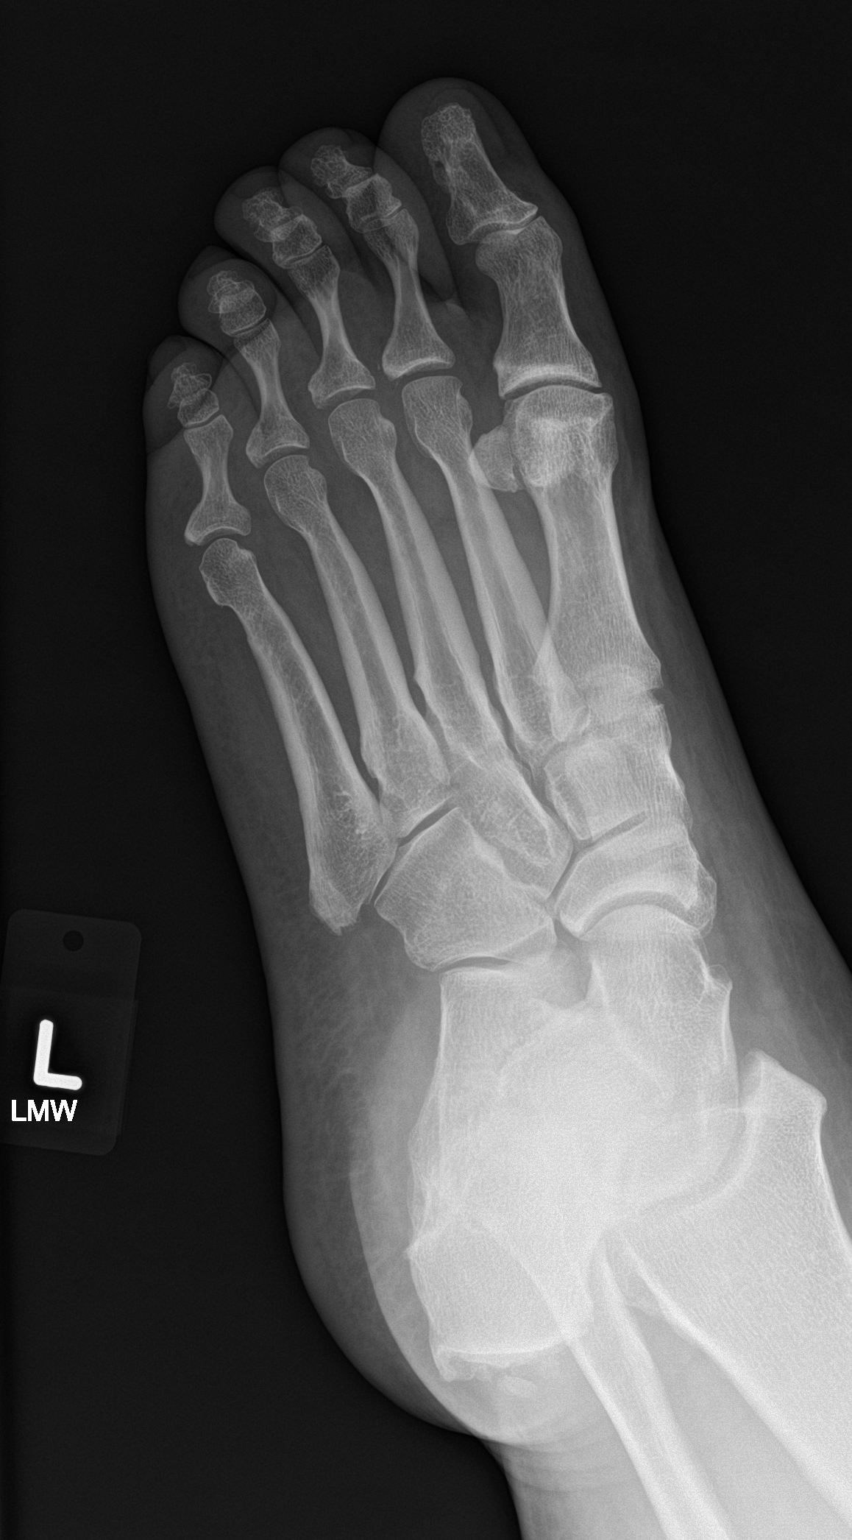

[foot lat]
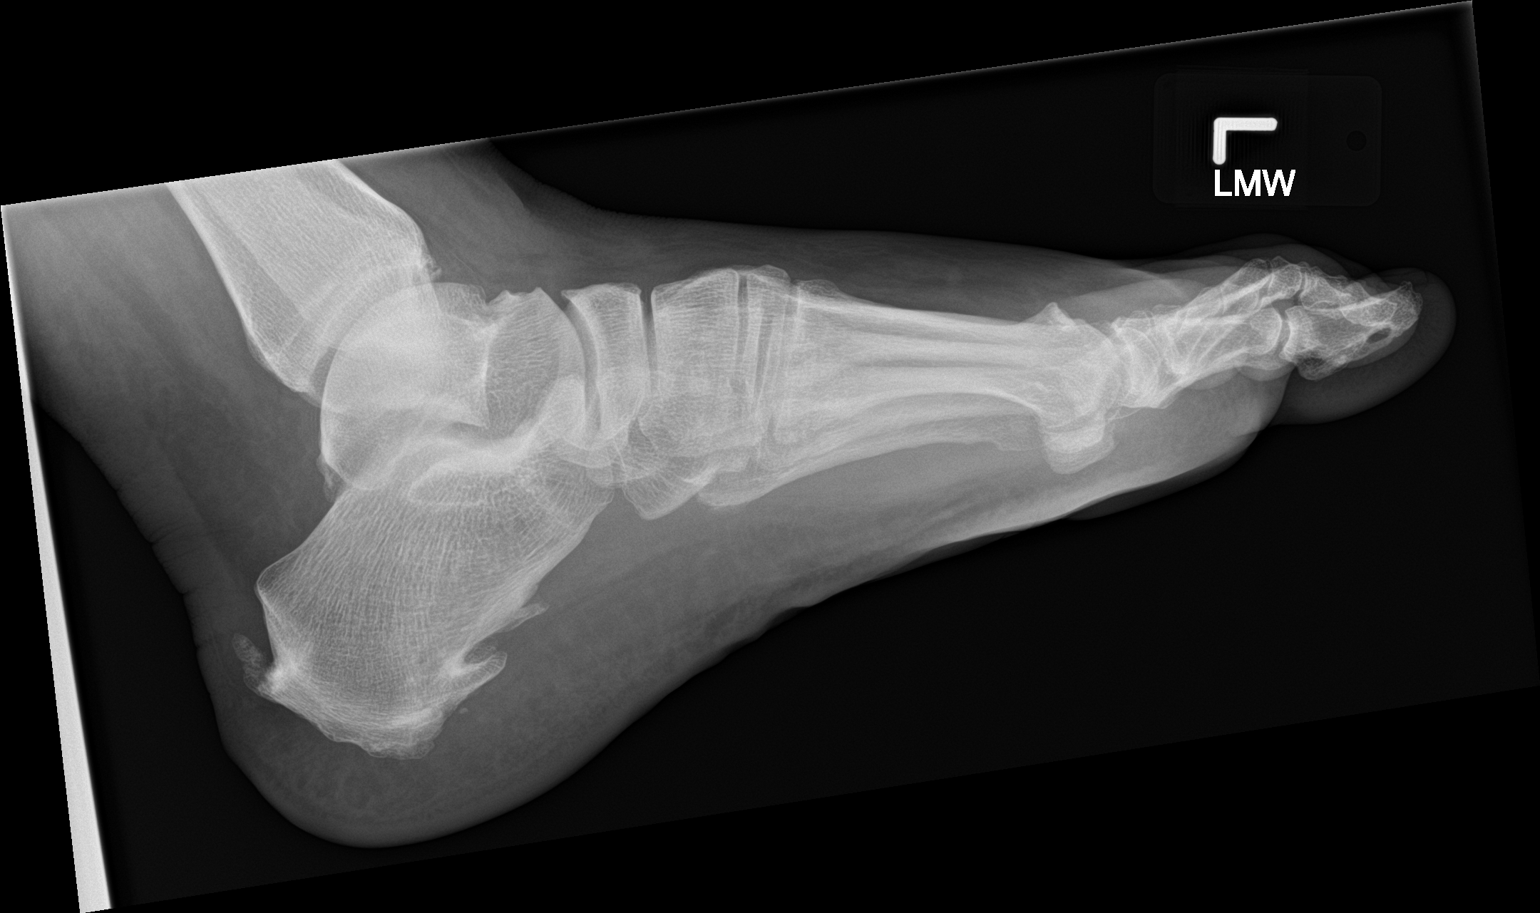

[3 of 3 positions shown; findings below may reference images not displayed]

FINDINGS: There is an obliquely oriented nondisplaced fracture along the shaft
of the fifth proximal phalanx. The fracture fragments are in
anatomic alignment. No dislocation.
IMPRESSION: 1. Acute oblique fracture deformity extends along the shaft of the
fifth proximal phalanx.

## 2020-03-12 ENCOUNTER — Encounter: Payer: Self-pay | Admitting: Emergency Medicine

## 2020-03-12 ENCOUNTER — Other Ambulatory Visit: Payer: Self-pay

## 2020-03-12 ENCOUNTER — Emergency Department
Admission: EM | Admit: 2020-03-12 | Discharge: 2020-03-12 | Disposition: A | Discharge status: Home / Self Care | Payer: PRIVATE HEALTH INSURANCE | Attending: Emergency Medicine | Admitting: Emergency Medicine

## 2020-03-12 ENCOUNTER — Emergency Department: Payer: PRIVATE HEALTH INSURANCE

## 2020-03-12 DIAGNOSIS — S93492A Sprain of other ligament of left ankle, initial encounter: Secondary | ICD-10-CM | POA: Insufficient documentation

## 2020-03-12 DIAGNOSIS — S99912A Unspecified injury of left ankle, initial encounter: Secondary | ICD-10-CM | POA: Diagnosis present

## 2020-03-12 DIAGNOSIS — Z87891 Personal history of nicotine dependence: Secondary | ICD-10-CM | POA: Insufficient documentation

## 2020-03-12 DIAGNOSIS — W1843XA Slipping, tripping and stumbling without falling due to stepping from one level to another, initial encounter: Secondary | ICD-10-CM | POA: Diagnosis not present

## 2020-03-12 MED ORDER — MELOXICAM 15 MG PO TABS: 15.0000 mg | ORAL_TABLET | Freq: Every day | ORAL | 2 refills | Status: AC

## 2020-03-12 NOTE — Discharge Instructions (Addendum)
Take Meloxicam once daily for pain and inflammation.  

## 2020-03-12 NOTE — ED Notes (Signed)
Pt states she stepped off a ladder wrong at work and hurt her ankle. Pt states 10/10 pain. Pt unable to bear weight on ankle at this time.   Pt in NAD at this time, A&Ox4.

## 2020-03-12 NOTE — ED Provider Notes (Signed)
[facility]  ____________________________________________  Time seen: Approximately 8:26 PM  I have reviewed the triage vital signs and the nursing notes.   HISTORY  Chief Complaint Ankle Injury   Historian Patient     HPI Carla Daniels is a 40 y.o. female presents to the emergency department with left ankle and foot pain after patient stepped down awkwardly from a ladder.  Patient is complaining of pain along the dorsal aspect of her left foot.  No numbness or tingling.  No abrasions or lacerations.   Past Medical History:  Diagnosis Date  . Anemia   . History of PCOS   . Iron deficiency   . Panic attacks      Immunizations up to date:  Yes.     Past Medical History:  Diagnosis Date  . Anemia   . History of PCOS   . Iron deficiency   . Panic attacks     There are no problems to display for this patient.   Past Surgical History:  Procedure Laterality Date  . ABDOMINAL SURGERY    . HYSTEROSCOPY WITH D & C N/A 08/21/2018   Procedure: DILATATION AND CURETTAGE Melton Krebs, fractional;  Surgeon: Schermerhorn, Ihor Austin, MD;  Location: [facility] ORS;  Service: Gynecology;  Laterality: N/A;  . TONSILLECTOMY      Prior to Admission medications   Medication Sig Start Date End Date Taking? Authorizing Provider  meloxicam (MOBIC) 15 MG tablet Take 1 tablet (15 mg total) by mouth daily. 03/12/20 03/12/21 Yes Pia Mau M, PA-C  Ascorbic Acid (VITAMIN C ADULT GUMMIES PO) Take 1 Piece by mouth daily.    [provider]  clindamycin (CLEOCIN) 150 MG capsule 2 capsules 3 times daily until finished 07/24/18   Bridget Hartshorn L, PA-C  cyclobenzaprine (FLEXERIL) 5 MG tablet Take 1 tablet (5 mg total) by mouth 3 (three) times daily as needed for muscle spasms. Patient not taking: Reported on 08/17/2018 10/29/17   Charlynne Pander, MD  HYDROcodone-acetaminophen (NORCO/VICODIN) 5-325 MG tablet Take 1 tablet by mouth every 4 (four) hours as needed for  moderate pain. 07/28/18   Cuthriell, Delorise Royals, PA-C  ibuprofen (ADVIL) 600 MG tablet Take 1 tablet (600 mg total) by mouth every 8 (eight) hours as needed. 07/24/18   Tommi Rumps, PA-C  medroxyPROGESTERone (PROVERA) 10 MG tablet Take 1 tablet (10 mg total) by mouth daily for 10 days. 07/28/18 08/07/18  Cuthriell, Delorise Royals, PA-C  Melatonin 5 MG TABS Take 5 mg by mouth at bedtime as needed (sleep).     [provider]  norethindrone-ethinyl estradiol 1/35 (ORTHO-NOVUM) tablet Take 1 tablet by mouth 2 (two) times a day.    [provider]    Allergies Soy allergy, Amoxicillin, Milk-related compounds, and Tramadol  History reviewed. No pertinent family history.  Social History Social History   Tobacco Use  . Smoking status: Former Games developer  . Smokeless tobacco: Never Used  Vaping Use  . Vaping Use: Never used  Substance Use Topics  . Alcohol use: Yes    Comment: occas  . Drug use: No     Review of Systems  Constitutional: No fever/chills Eyes:  No discharge ENT: No upper respiratory complaints. Respiratory: no cough. No SOB/ use of accessory muscles to breath Gastrointestinal:   No nausea, no vomiting.  No diarrhea.  No constipation. Musculoskeletal: Patient has left foot pain.  Skin: Negative for rash, abrasions, lacerations, ecchymosis.    ____________________________________________   PHYSICAL EXAM:  VITAL SIGNS: ED  Triage Vitals  Enc Vitals Group     BP 03/12/20 1910 (!) 150/96     Pulse Rate 03/12/20 1910 86     Resp 03/12/20 1910 20     Temp 03/12/20 1910 98.6 F (37 C)     Temp Source 03/12/20 1910 Oral     SpO2 03/12/20 1910 98 %     Weight 03/12/20 1913 279 lb (126.6 kg)     Height 03/12/20 1913 5\' 6"  (1.676 m)     Head Circumference --      Peak Flow --      Pain Score 03/12/20 1911 8     Pain Loc --      Pain Edu? --      Excl. in GC? --      Constitutional: Alert and oriented. Well appearing and in no acute distress. Eyes:  Conjunctivae are normal. PERRL. EOMI. Head: Atraumatic. Cardiovascular: Normal rate, regular rhythm. Normal S1 and S2.  Good peripheral circulation. Respiratory: Normal respiratory effort without tachypnea or retractions. Lungs CTAB. Good air entry to the bases with no decreased or absent breath sounds Gastrointestinal: Bowel sounds x 4 quadrants. Soft and nontender to palpation. No guarding or rigidity. No distention. Musculoskeletal: Full range of motion to all extremities. No obvious deformities noted.  Patient has pain to palpation over the dorsal aspect of the left foot.  No pain to palpation of the anterior posterior talofibular ligaments.  Palpable dorsalis pedis pulse, left.  Capillary refill less than 2 seconds on the left. Neurologic:  Normal for age. No gross focal neurologic deficits are appreciated.  Skin:  Skin is warm, dry and intact. No rash noted. Psychiatric: Mood and affect are normal for age. Speech and behavior are normal.   ____________________________________________   LABS (all labs ordered are listed, but only abnormal results are displayed)  Labs Reviewed - No data to display ____________________________________________  EKG   ____________________________________________  RADIOLOGY 05/10/20, personally viewed and evaluated these images (plain radiographs) as part of my medical decision making, as well as reviewing the written report by the radiologist.  DG Ankle Complete Left  Result Date: 03/12/2020 CLINICAL DATA:  Left ankle and foot injury, pain EXAM: LEFT FOOT - COMPLETE 3+ VIEW; LEFT ANKLE COMPLETE - 3+ VIEW COMPARISON:  10/13/2016 FINDINGS: Left ankle: Frontal, oblique, lateral views of the left ankle demonstrate no acute fracture, subluxation, or dislocation. Mild degenerative changes of the ankle mortise. Moderate these are pathic changes of the calcaneus. Anterior and lateral soft tissue swelling. Likely underlying ankle effusion. Left foot:  Frontal, oblique, and lateral views demonstrate no acute fractures. Alignment is anatomic. Mild joint space narrowing within the midfoot. Soft tissues are grossly normal. IMPRESSION: 1. No acute fracture of the left foot or ankle. 2. 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 3. Anterolateral soft tissue swelling at the ankle. Small underlying ankle effusion. Electronically Signed   By: 12/13/2016 M.D.   On: 03/12/2020 19:54   DG Foot Complete Left  Result Date: 03/12/2020 CLINICAL DATA:  Left ankle and foot injury, pain EXAM: LEFT FOOT - COMPLETE 3+ VIEW; LEFT ANKLE COMPLETE - 3+ VIEW COMPARISON:  10/13/2016 FINDINGS: Left ankle: Frontal, oblique, lateral views of the left ankle demonstrate no acute fracture, subluxation, or dislocation. Mild degenerative changes of the ankle mortise. Moderate these are pathic changes of the calcaneus. Anterior and lateral soft tissue swelling. Likely underlying ankle effusion. Left foot: Frontal, oblique, and lateral views demonstrate no acute fractures.  Alignment is anatomic. Mild joint space narrowing within the midfoot. Soft tissues are grossly normal. IMPRESSION: 1. No acute fracture of the left foot or ankle. 2. 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 3. Anterolateral soft tissue swelling at the ankle. Small underlying ankle effusion. Electronically Signed   By: Sharlet Salina M.D.   On: 03/12/2020 19:54    ____________________________________________    PROCEDURES  Procedure(s) performed:     Procedures     Medications - No data to display   ____________________________________________   INITIAL IMPRESSION / ASSESSMENT AND PLAN / ED COURSE  Pertinent labs & imaging results that were available during my care of the patient were reviewed by me and considered in my medical decision making (see chart for details).      Assessment and plan Left foot pain 40 year old female presents to the emergency department with acute left foot  pain and ankle pain after patient stepped down from a ladder awkwardly.  Patient was hypertensive at triage but vital signs were otherwise reassuring.  X-rays of the left ankle and left foot revealed no bony abnormality.  Meloxicam is recommended for pain and inflammation.  All patient questions were answered.     ____________________________________________  FINAL CLINICAL IMPRESSION(S) / ED DIAGNOSES  Final diagnoses:  Sprain of anterior talofibular ligament of left ankle, initial encounter      NEW MEDICATIONS STARTED DURING THIS VISIT:  ED Discharge Orders         Ordered    meloxicam (MOBIC) 15 MG tablet  Daily        03/12/20 2017              This chart was dictated using voice recognition software/Dragon. Despite best efforts to proofread, errors can occur which can change the meaning. Any change was purely unintentional.     Gasper Lloyd 03/12/20 2037    Phineas Semen, MD 03/12/20 2129

## 2020-03-12 NOTE — ED Notes (Signed)
Pt verbalized understanding of d/c instructions at this time. Pt denies further questions. Pt assisted to lobby to wait for ride at this time. First RN Elon Jester made aware

## 2020-03-12 NOTE — ED Notes (Signed)
Ice bag provided

## 2020-03-12 NOTE — ED Triage Notes (Addendum)
Patient wheelchair to triage with complaints of left ankle/foot injury at work at approx 1330.  Pt reports that she was coming off the last step of a ladder when "I stepped on to the side"  constant Sharp pain 8/10 at top of foot and ankle -- no obvious deformity, swelling compared to right side   Pt denies taking medications before coming to ED.  Speaking in complete coherent sentences. No acute breathing distress noted.

## 2020-03-12 NOTE — ED Triage Notes (Signed)
Per supervisor, Aundra Millet, pt is required UDS, Jonny Ruiz and Danelle Earthly via phone call

## 2020-09-09 ENCOUNTER — Emergency Department: Payer: BC Managed Care – PPO

## 2020-09-09 ENCOUNTER — Other Ambulatory Visit: Payer: Self-pay

## 2020-09-09 DIAGNOSIS — R42 Dizziness and giddiness: Secondary | ICD-10-CM | POA: Diagnosis present

## 2020-09-09 DIAGNOSIS — R791 Abnormal coagulation profile: Secondary | ICD-10-CM | POA: Insufficient documentation

## 2020-09-09 DIAGNOSIS — Z87891 Personal history of nicotine dependence: Secondary | ICD-10-CM | POA: Diagnosis not present

## 2020-09-09 LAB — COMPREHENSIVE METABOLIC PANEL
ALT: 15 U/L (ref 0–44)
AST: 15 U/L (ref 15–41)
Albumin: 4.1 g/dL (ref 3.5–5.0)
Alkaline Phosphatase: 58 U/L (ref 38–126)
Anion gap: 4 — ABNORMAL LOW (ref 5–15)
BUN: 17 mg/dL (ref 6–20)
CO2: 18 mmol/L — ABNORMAL LOW (ref 22–32)
Calcium: 9.1 mg/dL (ref 8.9–10.3)
Chloride: 114 mmol/L — ABNORMAL HIGH (ref 98–111)
Creatinine, Ser: 0.7 mg/dL (ref 0.44–1.00)
GFR, Estimated: >60 mL/min (ref >60)
Glucose, Bld: 190 mg/dL — ABNORMAL HIGH (ref 70–99)
Potassium: 3.6 mmol/L (ref 3.5–5.1)
Sodium: 136 mmol/L (ref 135–145)
Total Bilirubin: 0.6 mg/dL (ref 0.3–1.2)
Total Protein: 7.7 g/dL (ref 6.5–8.1)

## 2020-09-09 LAB — DIFFERENTIAL
Abs Immature Granulocytes: 0.04 10*3/uL (ref 0.00–0.07)
Basophils Absolute: 0.1 10*3/uL (ref 0.0–0.1)
Basophils Relative: 1 %
Eosinophils Absolute: 0.4 10*3/uL (ref 0.0–0.5)
Eosinophils Relative: 4 %
Immature Granulocytes: 0 %
Lymphocytes Relative: 32 %
Lymphs Abs: 3.4 10*3/uL (ref 0.7–4.0)
Monocytes Absolute: 0.6 10*3/uL (ref 0.1–1.0)
Monocytes Relative: 5 %
Neutro Abs: 6.3 10*3/uL (ref 1.7–7.7)
Neutrophils Relative %: 58 %

## 2020-09-09 LAB — APTT: aPTT: 26 seconds (ref 24–36)

## 2020-09-09 LAB — PROTIME-INR
INR: 1.1 (ref 0.8–1.2)
Prothrombin Time: 14.1 seconds (ref 11.4–15.2)

## 2020-09-09 LAB — CBC
HCT: 43 % (ref 36.0–46.0)
Hemoglobin: 14.6 g/dL (ref 12.0–15.0)
MCH: 29.1 pg (ref 26.0–34.0)
MCHC: 34 g/dL (ref 30.0–36.0)
MCV: 85.8 fL (ref 80.0–100.0)
Platelets: 326 10*3/uL (ref 150–400)
RBC: 5.01 MIL/uL (ref 3.87–5.11)
RDW: 13.2 % (ref 11.5–15.5)
WBC: 10.7 10*3/uL — ABNORMAL HIGH (ref 4.0–10.5)
nRBC: 0 % (ref 0.0–0.2)

## 2020-09-09 LAB — POC URINE PREG, ED: Preg Test, Ur: NEGATIVE

## 2020-09-09 LAB — CBG MONITORING, ED: Glucose-Capillary: 183 mg/dL — ABNORMAL HIGH (ref 70–99)

## 2020-09-09 MED ORDER — SODIUM CHLORIDE 0.9% FLUSH
3.0000 mL | Freq: Once | INTRAVENOUS | Status: DC
Start: 2020-09-09 — End: 2020-09-10

## 2020-09-09 NOTE — ED Triage Notes (Signed)
C/o dizziness x "several weeks". Pt. States room is spinning, and she feels like she is swaying and can't stand without losing balance. Worse when she turns head to the side. Symptoms resolve when lying down. Slurred speech intermittently. Denies CP/SOB.

## 2020-09-10 ENCOUNTER — Emergency Department
Admission: EM | Admit: 2020-09-10 | Discharge: 2020-09-10 | Disposition: A | Discharge status: Home / Self Care | Payer: BC Managed Care – PPO | Attending: Emergency Medicine | Admitting: Emergency Medicine

## 2020-09-10 DIAGNOSIS — R42 Dizziness and giddiness: Secondary | ICD-10-CM

## 2020-09-10 MED ORDER — DOXYCYCLINE HYCLATE 100 MG PO TABS: 100.0000 mg | ORAL_TABLET | Freq: Two times a day (BID) | ORAL | 0 refills | Status: DC

## 2020-09-10 MED ORDER — DIAZEPAM 2 MG PO TABS: 2.0000 mg | ORAL_TABLET | Freq: Three times a day (TID) | ORAL | 0 refills | Status: AC | PRN

## 2020-09-10 NOTE — Discharge Instructions (Addendum)
Please follow-up with your neurologist as scheduled.  Return to the emergency department for any worsening symptoms or any other symptom personally concerning to yourself.

## 2020-09-10 NOTE — ED Provider Notes (Signed)
[facility] Emergency Department Provider Note  Time seen: 1:25 AM  I have reviewed the triage vital signs and the nursing notes.   HISTORY  Chief Complaint Dizziness (C/o dizziness x "several weeks". Pt. States room is spinning, and she feels like she is swaying and can't stand without losing balance. Worse when she turns head to the side. Symptoms resolve when lying down. Slurred speech intermittently. Denies CP/SOB.) and Headache   HPI DESHANTI ADCOX is a 40 y.o. female with a past medical history of anemia, anxiety, presents to the emergency department for several weeks of dizziness which she feels is getting worse and not better.  According to the patient over the past 3 to 4 weeks she has been experiencing episodes of dizziness she states are mostly when she is sitting up or standing up and then resolve somewhat when she lies down.  Describes a spinning sensation.  Patient states she has had similar episodes in the past, has seen her neurologist for the same has had imaging performed as well as an EEG of her brain that have all been normal.  Patient states she is not able to work because of the off-balance and spinning sensation and she is not able to ambulate well.  Patient missed work once again so she came to the emergency department for evaluation.  Patient denies any significant headache.  Denies any weakness or numbness of any arm or leg at any point.   Past Medical History:  Diagnosis Date   Anemia    History of PCOS    Iron deficiency    Panic attacks     There are no problems to display for this patient.   Past Surgical History:  Procedure Laterality Date   ABDOMINAL SURGERY     HYSTEROSCOPY WITH D & C N/A 08/21/2018   Procedure: DILATATION AND CURETTAGE Melton Krebs, fractional;  Surgeon: Schermerhorn, Ihor Austin, MD;  Location: [facility] ORS;  Service: Gynecology;  Laterality: N/A;   TONSILLECTOMY      Prior to Admission medications   Medication  Sig Start Date End Date Taking? Authorizing Provider  diazepam (VALIUM) 2 MG tablet Take 1 tablet (2 mg total) by mouth every 8 (eight) hours as needed for anxiety (dizziness). 09/10/20 09/10/21 Yes Ambrea Hegler, Caryn Bee, MD  Ascorbic Acid (VITAMIN C ADULT GUMMIES PO) Take 1 Piece by mouth daily.    [provider]  clindamycin (CLEOCIN) 150 MG capsule 2 capsules 3 times daily until finished 07/24/18   Bridget Hartshorn L, PA-C  cyclobenzaprine (FLEXERIL) 5 MG tablet Take 1 tablet (5 mg total) by mouth 3 (three) times daily as needed for muscle spasms. Patient not taking: Reported on 08/17/2018 10/29/17   Charlynne Pander, MD  HYDROcodone-acetaminophen (NORCO/VICODIN) 5-325 MG tablet Take 1 tablet by mouth every 4 (four) hours as needed for moderate pain. 07/28/18   Cuthriell, Delorise Royals, PA-C  ibuprofen (ADVIL) 600 MG tablet Take 1 tablet (600 mg total) by mouth every 8 (eight) hours as needed. 07/24/18   Tommi Rumps, PA-C  medroxyPROGESTERone (PROVERA) 10 MG tablet Take 1 tablet (10 mg total) by mouth daily for 10 days. 07/28/18 08/07/18  Cuthriell, Delorise Royals, PA-C  Melatonin 5 MG TABS Take 5 mg by mouth at bedtime as needed (sleep).     [provider]  meloxicam (MOBIC) 15 MG tablet Take 1 tablet (15 mg total) by mouth daily. 03/12/20 03/12/21  Orvil Feil, PA-C  norethindrone-ethinyl estradiol 1/35 (ORTHO-NOVUM) tablet Take 1 tablet  by mouth 2 (two) times a day.    [provider]    Allergies  Allergen Reactions   Soy Allergy Anaphylaxis   Amoxicillin Nausea And Vomiting   Milk-Related Compounds Nausea Only   Tramadol Rash    No family history on file.  Social History Social History   Tobacco Use   Smoking status: Former   Smokeless tobacco: Never  Building services engineer Use: Never used  Substance Use Topics   Alcohol use: Yes    Comment: occas   Drug use: No    Review of Systems Constitutional: Negative for fever.  Positive for intermittent  dizziness. Cardiovascular: Negative for chest pain. Respiratory: Negative for shortness of breath. Gastrointestinal: Negative for abdominal pain, vomiting Musculoskeletal: Negative for musculoskeletal complaints Neurological: Negative for headache All other ROS negative  ____________________________________________   PHYSICAL EXAM:  VITAL SIGNS: ED Triage Vitals  Enc Vitals Group     BP 09/09/20 2052 124/83     Pulse Rate 09/09/20 2052 87     Resp 09/09/20 2052 18     Temp 09/09/20 2055 97.8 F (36.6 C)     Temp Source 09/09/20 2055 Oral     SpO2 09/09/20 2052 98 %     Weight 09/09/20 2053 (!) 308 lb (139.7 kg)     Height 09/09/20 2053 5\' 6"  (1.676 m)     Head Circumference --      Peak Flow --      Pain Score 09/09/20 2053 0     Pain Loc --      Pain Edu? --      Excl. in GC? --    Constitutional: Alert and oriented. Well appearing and in no distress. Eyes: Normal exam ENT      Head: Normocephalic and atraumatic.  Normal tympanic membranes.      Mouth/Throat: Mucous membranes are moist. Cardiovascular: Normal rate, regular rhythm.  Respiratory: Normal respiratory effort without tachypnea nor retractions. Breath sounds are clear  Gastrointestinal: Soft and nontender. No distention.   Musculoskeletal: Nontender with normal range of motion in all extremities. No lower extremity tenderness or edema. Neurologic:  Normal speech and language. No gross focal neurologic deficits are appreciated.  Equal grip strengths.  No cranial nerve deficits.  No pronator drift. Skin:  Skin is warm, dry and intact.  Psychiatric: Mood and affect are normal. Speech and behavior are normal.   ____________________________________________    EKG  EKG viewed and interpreted by myself shows normal sinus rhythm 85 bpm with a narrow QRS, normal axis, normal intervals, no concerning ST changes.  ____________________________________________    RADIOLOGY  CT scan of the head is negative for  acute abnormality  ____________________________________________   INITIAL IMPRESSION / ASSESSMENT AND PLAN / ED COURSE  Pertinent labs & imaging results that were available during my care of the patient were reviewed by me and considered in my medical decision making (see chart for details).   Patient presents to the emergency department for intermittent dizziness over the past month or so.  Overall the patient appears well, reassuring physical exam.  Reassuring vitals EKG lab work and CT scan of the head.  Patient has already followed up with her doctor has a neurologist that she sees as well.  Patient has tried meclizine with no relief.  We will prescribe a very short course of Valium to see if it is helpful for the patient.  I discussed with patient no drinking alcohol or driving while taking  this medication.  Patient is to follow-up with her neurologist with whom she has an appointment on 09/13/2020.  LINCOLN KLEINER was evaluated in Emergency Department on 09/10/2020 for the symptoms described in the history of present illness. She was evaluated in the context of the global COVID-19 pandemic, which necessitated consideration that the patient might be at risk for infection with the SARS-CoV-2 virus that causes COVID-19. Institutional protocols and algorithms that pertain to the evaluation of patients at risk for COVID-19 are in a state of rapid change based on information released by regulatory bodies including the CDC and federal and state organizations. These policies and algorithms were followed during the patient's care in the ED.  ____________________________________________   FINAL CLINICAL IMPRESSION(S) / ED DIAGNOSES  Dizziness   Minna Antis, MD 09/10/20 0128

## 2020-09-10 NOTE — ED Notes (Signed)
Unable to obtain signature at time of discharge as there is no signature pad available in the room. Discussed discharge information with pt, no further questions or concerns noted. She will f/u with her PCP, neurology, and cardiology when providers' offices are open.

## 2020-09-10 NOTE — ED Notes (Signed)
Pt states she has felt intermittently dizzy for several days and has not been able to go to work since 8/3 due to her symptoms. A/O x 4 in NAD with no slurred speech or impaired gait noted on arrival to room. Able to transfer from wheelchair to stretcher unassisted. Speaking in clear, complete sentences with no difficulty.

## 2021-02-10 ENCOUNTER — Other Ambulatory Visit: Payer: Self-pay

## 2021-02-10 DIAGNOSIS — T7840XA Allergy, unspecified, initial encounter: Secondary | ICD-10-CM | POA: Diagnosis not present

## 2021-02-10 DIAGNOSIS — Z5321 Procedure and treatment not carried out due to patient leaving prior to being seen by health care provider: Secondary | ICD-10-CM | POA: Insufficient documentation

## 2021-02-10 NOTE — ED Triage Notes (Signed)
Pt presents via EMS following an allergic reaction to which she believes was dairy products. She states that her "throat is closing up, but I can still breathe." She took one 25mg  Bendaryl PTA and she states that things have gotten better but she's still concerned. Pt is able to complete full sentences and declines supplemental oxygen. O2 sats on RA is 100%. No erythema or edema in her throat at this time upon assessment. Denies CP.

## 2021-02-10 NOTE — ED Notes (Addendum)
Per ems pt with possible allergic reaction: vitals 143/84, hr 80, pox 98%, resps 24. Per ems no angioedema, no rash noted. Per ems pt took benadryl pta

## 2021-02-11 ENCOUNTER — Emergency Department
Admission: EM | Admit: 2021-02-11 | Discharge: 2021-02-11 | Disposition: A | Discharge status: Home / Self Care | Payer: BC Managed Care – PPO | Attending: Emergency Medicine | Admitting: Emergency Medicine

## 2021-02-11 MED ORDER — DIPHENHYDRAMINE HCL 25 MG PO CAPS
25.0000 mg | ORAL_CAPSULE | Freq: Once | ORAL | Status: AC
  Administered 2021-02-11: 25 mg via ORAL
  Filled 2021-02-11: qty 1

## 2021-02-11 NOTE — ED Notes (Signed)
Informed by registration that pt stated she was feeling better and is leaving and will return if she feels worse.

## 2022-05-16 IMAGING — DX DG FOOT COMPLETE 3+V*L*
3 series · 3 of 3 positions shown · non-contrast
Comparison: 10/13/2016

CLINICAL DATA: Left ankle and foot injury, pain

EXAM:
LEFT FOOT - COMPLETE 3+ VIEW; LEFT ANKLE COMPLETE - 3+ VIEW

[foot ap]
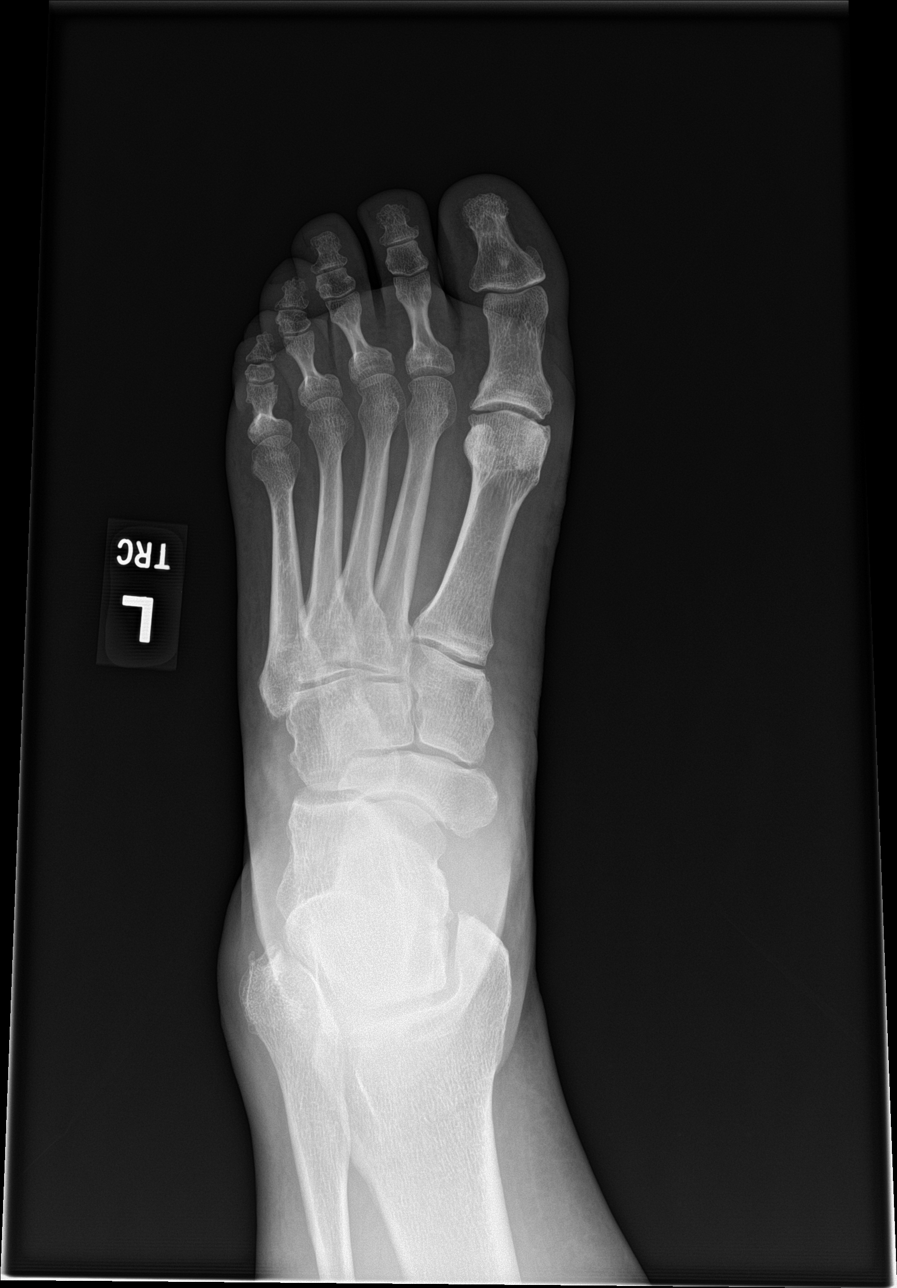

[foot obl]
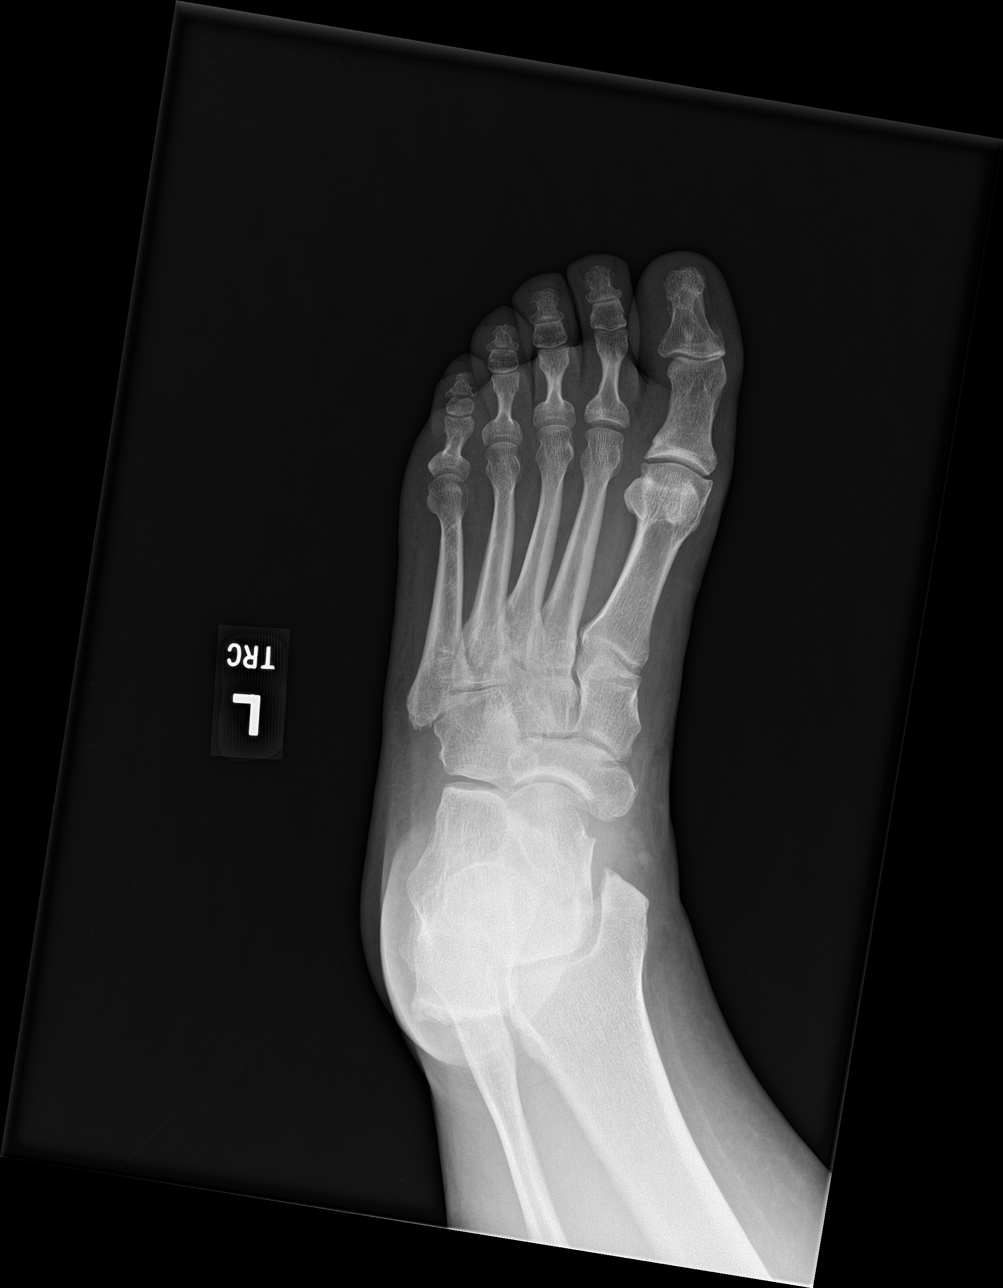

[foot lat]
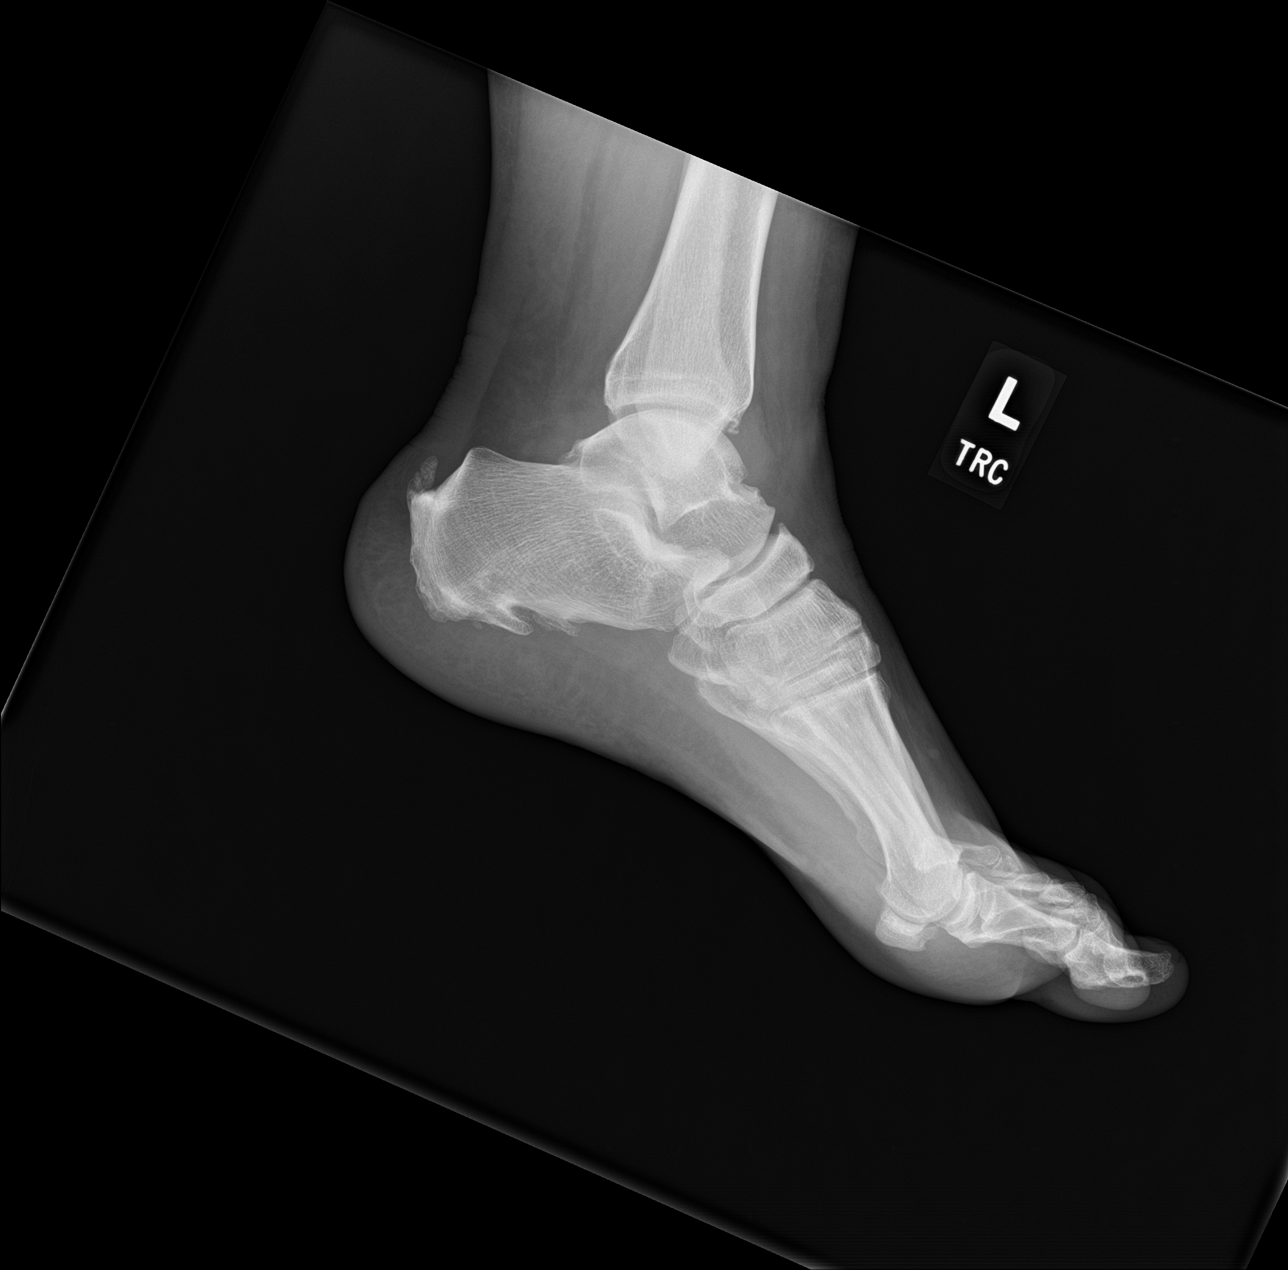

[3 of 3 positions shown; findings below may reference images not displayed]

FINDINGS: Left ankle: Frontal, oblique, lateral views of the left ankle
demonstrate no acute fracture, subluxation, or dislocation. Mild
degenerative changes of the ankle mortise. Moderate these are pathic
changes of the calcaneus. Anterior and lateral soft tissue swelling.
Likely underlying ankle effusion.

Left foot: Frontal, oblique, and lateral views demonstrate no acute
fractures. Alignment is anatomic. Mild joint space narrowing within
the midfoot. Soft tissues are grossly normal.
IMPRESSION: 1. No acute fracture of the left foot or ankle.
2. Mild osteoarthritis of the ankle and midfoot.
3. Anterolateral soft tissue swelling at the ankle. Small underlying
ankle effusion.

## 2022-05-19 ENCOUNTER — Emergency Department: Payer: BC Managed Care – PPO

## 2022-05-19 ENCOUNTER — Emergency Department
Admission: EM | Admit: 2022-05-19 | Discharge: 2022-05-19 | Disposition: A | Discharge status: Home / Self Care | Payer: BC Managed Care – PPO | Attending: Emergency Medicine | Admitting: Emergency Medicine

## 2022-05-19 ENCOUNTER — Encounter: Payer: Self-pay | Admitting: Emergency Medicine

## 2022-05-19 ENCOUNTER — Other Ambulatory Visit: Payer: Self-pay

## 2022-05-19 DIAGNOSIS — J349 Unspecified disorder of nose and nasal sinuses: Secondary | ICD-10-CM

## 2022-05-19 DIAGNOSIS — J329 Chronic sinusitis, unspecified: Secondary | ICD-10-CM | POA: Diagnosis not present

## 2022-05-19 DIAGNOSIS — R519 Headache, unspecified: Secondary | ICD-10-CM | POA: Diagnosis present

## 2022-05-19 DIAGNOSIS — I10 Essential (primary) hypertension: Secondary | ICD-10-CM | POA: Diagnosis not present

## 2022-05-19 DIAGNOSIS — G43809 Other migraine, not intractable, without status migrainosus: Secondary | ICD-10-CM | POA: Diagnosis not present

## 2022-05-19 MED ORDER — SODIUM CHLORIDE 0.9 % IV BOLUS
500.0000 mL | Freq: Once | INTRAVENOUS | Status: AC
  Administered 2022-05-19: 500 mL via INTRAVENOUS

## 2022-05-19 MED ORDER — DIPHENHYDRAMINE HCL 50 MG/ML IJ SOLN
25.0000 mg | Freq: Once | INTRAMUSCULAR | Status: AC
  Administered 2022-05-19: 25 mg via INTRAVENOUS
  Filled 2022-05-19: qty 1

## 2022-05-19 MED ORDER — DOXYCYCLINE HYCLATE 100 MG PO TABS: 100.0000 mg | ORAL_TABLET | Freq: Two times a day (BID) | ORAL | 0 refills | Status: AC

## 2022-05-19 MED ORDER — KETOROLAC TROMETHAMINE 30 MG/ML IJ SOLN
30.0000 mg | Freq: Once | INTRAMUSCULAR | Status: AC
  Administered 2022-05-19: 30 mg via INTRAVENOUS
  Filled 2022-05-19: qty 1

## 2022-05-19 MED ORDER — DOXYCYCLINE HYCLATE 100 MG PO TABS
100.0000 mg | ORAL_TABLET | Freq: Once | ORAL | Status: AC
  Administered 2022-05-19: 100 mg via ORAL
  Filled 2022-05-19: qty 1

## 2022-05-19 MED ORDER — METOCLOPRAMIDE HCL 5 MG/ML IJ SOLN
20.0000 mg | Freq: Once | INTRAVENOUS | Status: AC
  Administered 2022-05-19: 20 mg via INTRAVENOUS
  Filled 2022-05-19: qty 4

## 2022-05-19 NOTE — ED Provider Notes (Signed)
[facility] Provider Note    Event Date/Time   First MD Initiated Contact with Patient 05/19/22 1736     (approximate)   History   Hypertension   HPI  Carla Daniels is a 42 y.o. female who presents with complaints of high blood pressure and headache.  Patient describes throbbing frontal headache over the last 2 days with photosensitivity.  She denies neurodeficits.  Mild nausea no vomiting.  Denies a history of migraines.  No head trauma.  No fevers.  No neck pain     Physical Exam   Triage Vital Signs: ED Triage Vitals  Enc Vitals Group     BP 05/19/22 1732 (!) 166/115     Pulse Rate 05/19/22 1732 87     Resp 05/19/22 1732 18     Temp 05/19/22 1732 98.8 F (37.1 C)     Temp src --      SpO2 05/19/22 1732 96 %     Weight 05/19/22 1733 (!) 162.8 kg (359 lb)     Height 05/19/22 1733 1.676 m (5\' 6" )     Head Circumference --      Peak Flow --      Pain Score 05/19/22 1735 8     Pain Loc --      Pain Edu? --      Excl. in GC? --     Most recent vital signs: Vitals:   05/19/22 1732  BP: (!) 166/115  Pulse: 87  Resp: 18  Temp: 98.8 F (37.1 C)  SpO2: 96%     General: Awake, no distress.  CV:  Good peripheral perfusion.  Resp:  Normal effort.  Abd:  No distention.  Other:     ED Results / Procedures / Treatments   Labs (all labs ordered are listed, but only abnormal results are displayed) Labs Reviewed - No data to display   EKG     RADIOLOGY     PROCEDURES:  Critical Care performed:   Procedures   MEDICATIONS ORDERED IN ED: Medications  doxycycline (VIBRA-TABS) tablet 100 mg (has no administration in time range)  ketorolac (TORADOL) 30 MG/ML injection 30 mg (30 mg Intravenous Given 05/19/22 1815)  metoCLOPramide (REGLAN) 20 mg in dextrose 5 % 50 mL IVPB (0 mg Intravenous Stopped 05/19/22 1845)  diphenhydrAMINE (BENADRYL) injection 25 mg (25 mg Intravenous Given 05/19/22 1815)  sodium chloride 0.9 % bolus  500 mL (0 mLs Intravenous Stopped 05/19/22 1845)     IMPRESSION / MDM / ASSESSMENT AND PLAN / ED COURSE  I reviewed the triage vital signs and the nursing notes. Patient's presentation is most consistent with acute presentation with potential threat to life or bodily function.  Patient presents with hypertension and headache as above.  Differential includes migraine, sinusitis, less likely ICH  Will send for CT head, treat with Toradol, Reglan, Benadryl, fluids and reevaluate  CT head demonstrates sinusitis but otherwise reassuring, this could be the cause of her headache  ----------------------------------------- 6:46 PM on 05/19/2022 ----------------------------------------- Patient feeling improved, appropriate for discharge with outpatient follow-up, return precautions discussed, will Rx doxycycline for sinus disease        FINAL CLINICAL IMPRESSION(S) / ED DIAGNOSES   Final diagnoses:  Other migraine without status migrainosus, not intractable  Sinus disease     Rx / DC Orders   ED Discharge Orders          Ordered    doxycycline (VIBRA-TABS) 100 MG tablet  2 times  daily        05/19/22 1845             Note:  This document was prepared using Dragon voice recognition software and may include unintentional dictation errors.   Jene Every, MD 05/19/22 [phone]

## 2022-05-19 NOTE — ED Triage Notes (Signed)
Pt reports hypertension, headache, right ear pain, and light sensitivity x 2 days.

## 2022-10-21 ENCOUNTER — Other Ambulatory Visit: Payer: Self-pay

## 2022-10-21 ENCOUNTER — Encounter: Payer: Self-pay | Admitting: Emergency Medicine

## 2022-10-21 ENCOUNTER — Emergency Department: Payer: BC Managed Care – PPO

## 2022-10-21 DIAGNOSIS — Z5321 Procedure and treatment not carried out due to patient leaving prior to being seen by health care provider: Secondary | ICD-10-CM | POA: Insufficient documentation

## 2022-10-21 DIAGNOSIS — R42 Dizziness and giddiness: Secondary | ICD-10-CM | POA: Diagnosis not present

## 2022-10-21 DIAGNOSIS — R0789 Other chest pain: Secondary | ICD-10-CM | POA: Diagnosis present

## 2022-10-21 DIAGNOSIS — R0602 Shortness of breath: Secondary | ICD-10-CM | POA: Insufficient documentation

## 2022-10-21 LAB — BASIC METABOLIC PANEL
Anion gap: 13 (ref 5–15)
BUN: 13 mg/dL (ref 6–20)
CO2: 23 mmol/L (ref 22–32)
Calcium: 9.2 mg/dL (ref 8.9–10.3)
Chloride: 96 mmol/L — ABNORMAL LOW (ref 98–111)
Creatinine, Ser: 0.77 mg/dL (ref 0.44–1.00)
GFR, Estimated: >60 mL/min (ref >60)
Glucose, Bld: 434 mg/dL — ABNORMAL HIGH (ref 70–99)
Potassium: 3.9 mmol/L (ref 3.5–5.1)
Sodium: 132 mmol/L — ABNORMAL LOW (ref 135–145)

## 2022-10-21 LAB — CBC
HCT: 44.2 % (ref 36.0–46.0)
Hemoglobin: 14.5 g/dL (ref 12.0–15.0)
MCH: 27.6 pg (ref 26.0–34.0)
MCHC: 32.8 g/dL (ref 30.0–36.0)
MCV: 84 fL (ref 80.0–100.0)
Platelets: 290 10*3/uL (ref 150–400)
RBC: 5.26 MIL/uL — ABNORMAL HIGH (ref 3.87–5.11)
RDW: 13.5 % (ref 11.5–15.5)
WBC: 9.1 10*3/uL (ref 4.0–10.5)
nRBC: 0 % (ref 0.0–0.2)

## 2022-10-21 LAB — TROPONIN I (HIGH SENSITIVITY): Troponin I (High Sensitivity): 3 ng/L (ref <18)

## 2022-10-21 NOTE — ED Triage Notes (Signed)
Pt in with sharp L sided cp that began ago. Pt states she first began to feel dizzy at work, sat down for awhile, and then cp started. +sob, pain radiates down L arm.

## 2022-10-21 NOTE — ED Notes (Signed)
Rainbow being sent to lab at this time.

## 2022-10-22 ENCOUNTER — Telehealth: Payer: Self-pay | Admitting: Emergency Medicine

## 2022-10-22 ENCOUNTER — Emergency Department
Admission: EM | Admit: 2022-10-22 | Discharge: 2022-10-22 | Discharge status: Against Medical Advice | Payer: BC Managed Care – PPO | Attending: Emergency Medicine | Admitting: Emergency Medicine

## 2022-10-22 LAB — BRAIN NATRIURETIC PEPTIDE: B Natriuretic Peptide: 10.7 pg/mL (ref 0.0–100.0)

## 2022-10-22 NOTE — ED Notes (Signed)
No answer when called several times from lobby 

## 2022-10-22 NOTE — Telephone Encounter (Signed)
Called patient due to left emergency department before provider exam to inquire about condition and follow up plans. Left message.

## 2023-10-03 ENCOUNTER — Emergency Department

## 2023-10-03 ENCOUNTER — Other Ambulatory Visit: Payer: Self-pay

## 2023-10-03 ENCOUNTER — Emergency Department: Admission: EM | Admit: 2023-10-03 | Discharge: 2023-10-03 | Disposition: A | Discharge status: Home / Self Care

## 2023-10-03 DIAGNOSIS — S20219A Contusion of unspecified front wall of thorax, initial encounter: Secondary | ICD-10-CM | POA: Diagnosis not present

## 2023-10-03 DIAGNOSIS — Y92410 Unspecified street and highway as the place of occurrence of the external cause: Secondary | ICD-10-CM | POA: Diagnosis not present

## 2023-10-03 DIAGNOSIS — R079 Chest pain, unspecified: Secondary | ICD-10-CM | POA: Diagnosis present

## 2023-10-03 DIAGNOSIS — M79605 Pain in left leg: Secondary | ICD-10-CM | POA: Insufficient documentation

## 2023-10-03 DIAGNOSIS — S0081XA Abrasion of other part of head, initial encounter: Secondary | ICD-10-CM | POA: Diagnosis not present

## 2023-10-03 LAB — CBC WITH DIFFERENTIAL/PLATELET
Abs Immature Granulocytes: 0.04 K/uL (ref 0.00–0.07)
Basophils Absolute: 0.1 K/uL (ref 0.0–0.1)
Basophils Relative: 1 %
Eosinophils Absolute: 0.3 K/uL (ref 0.0–0.5)
Eosinophils Relative: 2 %
HCT: 44 % (ref 36.0–46.0)
Hemoglobin: 14.3 g/dL (ref 12.0–15.0)
Immature Granulocytes: 0 %
Lymphocytes Relative: 27 %
Lymphs Abs: 3 K/uL (ref 0.7–4.0)
MCH: 27.4 pg (ref 26.0–34.0)
MCHC: 32.5 g/dL (ref 30.0–36.0)
MCV: 84.5 fL (ref 80.0–100.0)
Monocytes Absolute: 0.6 K/uL (ref 0.1–1.0)
Monocytes Relative: 6 %
Neutro Abs: 6.9 K/uL (ref 1.7–7.7)
Neutrophils Relative %: 64 %
Platelets: 376 K/uL (ref 150–400)
RBC: 5.21 MIL/uL — ABNORMAL HIGH (ref 3.87–5.11)
RDW: 13.9 % (ref 11.5–15.5)
WBC: 10.8 K/uL — ABNORMAL HIGH (ref 4.0–10.5)
nRBC: 0 % (ref 0.0–0.2)

## 2023-10-03 LAB — COMPREHENSIVE METABOLIC PANEL WITH GFR
ALT: 14 U/L (ref 0–44)
AST: 20 U/L (ref 15–41)
Albumin: 3.7 g/dL (ref 3.5–5.0)
Alkaline Phosphatase: 63 U/L (ref 38–126)
Anion gap: 10 (ref 5–15)
BUN: 13 mg/dL (ref 6–20)
CO2: 24 mmol/L (ref 22–32)
Calcium: 9.1 mg/dL (ref 8.9–10.3)
Chloride: 104 mmol/L (ref 98–111)
Creatinine, Ser: 0.57 mg/dL (ref 0.44–1.00)
GFR, Estimated: >60 mL/min (ref >60)
Glucose, Bld: 203 mg/dL — ABNORMAL HIGH (ref 70–99)
Potassium: 3.7 mmol/L (ref 3.5–5.1)
Sodium: 138 mmol/L (ref 135–145)
Total Bilirubin: 0.7 mg/dL (ref 0.0–1.2)
Total Protein: 7.8 g/dL (ref 6.5–8.1)

## 2023-10-03 LAB — HCG, QUANTITATIVE, PREGNANCY: hCG, Beta Chain, Quant, S: 1 m[IU]/mL (ref <5)

## 2023-10-03 LAB — LIPASE, BLOOD: Lipase: 31 U/L (ref 11–51)

## 2023-10-03 MED ORDER — SODIUM CHLORIDE 0.9 % IV BOLUS (SEPSIS)
1000.0000 mL | Freq: Once | INTRAVENOUS | Status: AC
  Administered 2023-10-03: 1000 mL via INTRAVENOUS

## 2023-10-03 MED ORDER — ONDANSETRON HCL 4 MG/2ML IJ SOLN
4.0000 mg | Freq: Once | INTRAMUSCULAR | Status: AC
  Administered 2023-10-03: 4 mg via INTRAVENOUS
  Filled 2023-10-03: qty 2

## 2023-10-03 MED ORDER — IOHEXOL 350 MG/ML SOLN
100.0000 mL | Freq: Once | INTRAVENOUS | Status: AC | PRN
  Administered 2023-10-03: 100 mL via INTRAVENOUS

## 2023-10-03 MED ORDER — SODIUM CHLORIDE 0.9 % IV SOLN: 1000.0000 mL | INTRAVENOUS | Status: DC

## 2023-10-03 MED ORDER — FENTANYL CITRATE PF 50 MCG/ML IJ SOSY
50.0000 ug | PREFILLED_SYRINGE | Freq: Once | INTRAMUSCULAR | Status: AC
  Administered 2023-10-03: 50 ug via INTRAVENOUS
  Filled 2023-10-03: qty 1

## 2023-10-03 NOTE — ED Notes (Signed)
 Pt ambulated to bathroom prior to discharge without difficulty. PT in no acute distress prior to discharge. Discharged instructions reviewed and pt stated that they understand directions. Pt has all belongings with them at time of discharge.
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
## 2023-10-03 NOTE — ED Notes (Signed)
 The patient informed this RN that her left arm felt weak and she felt like she had decreased sensation in it at this time. This RN notified MD at this time.
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
## 2023-11-13 ENCOUNTER — Other Ambulatory Visit: Payer: Self-pay | Admitting: Internal Medicine

## 2023-11-13 DIAGNOSIS — Z1231 Encounter for screening mammogram for malignant neoplasm of breast: Secondary | ICD-10-CM
